# Patient Record
Sex: Female | Born: 1960 | Race: White | Hispanic: No | Marital: Married | Smoking: Never smoker
Health system: Southern US, Community
[De-identification: ages and names within clinical notes are randomized; demographics above are authoritative.]

## PROBLEM LIST (undated history)

## (undated) DIAGNOSIS — J45909 Unspecified asthma, uncomplicated: Secondary | ICD-10-CM

## (undated) DIAGNOSIS — L309 Dermatitis, unspecified: Secondary | ICD-10-CM

## (undated) DIAGNOSIS — I2542 Coronary artery dissection: Secondary | ICD-10-CM

## (undated) DIAGNOSIS — E039 Hypothyroidism, unspecified: Secondary | ICD-10-CM

## (undated) DIAGNOSIS — G4730 Sleep apnea, unspecified: Secondary | ICD-10-CM

## (undated) DIAGNOSIS — N183 Chronic kidney disease, stage 3 unspecified: Secondary | ICD-10-CM

## (undated) DIAGNOSIS — K760 Fatty (change of) liver, not elsewhere classified: Secondary | ICD-10-CM

## (undated) DIAGNOSIS — I219 Acute myocardial infarction, unspecified: Secondary | ICD-10-CM

## (undated) DIAGNOSIS — K219 Gastro-esophageal reflux disease without esophagitis: Secondary | ICD-10-CM

## (undated) DIAGNOSIS — R011 Cardiac murmur, unspecified: Secondary | ICD-10-CM

## (undated) DIAGNOSIS — C801 Malignant (primary) neoplasm, unspecified: Secondary | ICD-10-CM

## (undated) DIAGNOSIS — I10 Essential (primary) hypertension: Secondary | ICD-10-CM

## (undated) DIAGNOSIS — E042 Nontoxic multinodular goiter: Secondary | ICD-10-CM

## (undated) DIAGNOSIS — Z9289 Personal history of other medical treatment: Secondary | ICD-10-CM

## (undated) DIAGNOSIS — N189 Chronic kidney disease, unspecified: Secondary | ICD-10-CM

## (undated) HISTORY — PX: ESOPHAGECTOMY: SUR457

## (undated) HISTORY — DX: Coronary artery dissection: I25.42

## (undated) HISTORY — DX: Acute myocardial infarction, unspecified: I21.9

## (undated) HISTORY — PX: COLONOSCOPY: SHX174

## (undated) HISTORY — DX: Personal history of other medical treatment: Z92.89

## (undated) HISTORY — PX: TUBAL LIGATION: SHX77

## (undated) HISTORY — PX: EXPLORATORY LAPAROTOMY: SUR591

## (undated) HISTORY — PX: UPPER GI ENDOSCOPY: SHX6162

## (undated) HISTORY — PX: THYROID LOBECTOMY: SHX420

## (undated) HISTORY — DX: Chronic kidney disease, stage 3 unspecified: N18.30

## (undated) HISTORY — PX: ABDOMINAL HYSTERECTOMY: SHX81

## (undated) HISTORY — PX: DIAGNOSTIC LAPAROSCOPY: SUR761

---

## 2004-07-17 ENCOUNTER — Ambulatory Visit: Payer: Self-pay | Admitting: Unknown Physician Specialty

## 2004-08-14 ENCOUNTER — Ambulatory Visit: Payer: Self-pay | Admitting: Gastroenterology

## 2004-09-18 ENCOUNTER — Ambulatory Visit: Payer: Self-pay | Admitting: Gastroenterology

## 2005-09-10 ENCOUNTER — Ambulatory Visit: Payer: Self-pay

## 2006-09-15 ENCOUNTER — Ambulatory Visit: Payer: Self-pay

## 2007-09-27 ENCOUNTER — Ambulatory Visit: Payer: Self-pay

## 2007-10-03 ENCOUNTER — Ambulatory Visit: Payer: Self-pay

## 2008-08-15 ENCOUNTER — Ambulatory Visit: Payer: Self-pay

## 2008-10-03 ENCOUNTER — Ambulatory Visit: Payer: Self-pay

## 2009-10-08 ENCOUNTER — Ambulatory Visit: Payer: Self-pay

## 2010-11-04 ENCOUNTER — Ambulatory Visit: Payer: Self-pay

## 2011-10-07 ENCOUNTER — Ambulatory Visit: Payer: Self-pay

## 2011-10-28 ENCOUNTER — Ambulatory Visit: Payer: Self-pay | Admitting: Specialist

## 2011-11-04 ENCOUNTER — Ambulatory Visit: Payer: Self-pay | Admitting: Gastroenterology

## 2011-11-05 ENCOUNTER — Emergency Department: Payer: Self-pay | Admitting: Emergency Medicine

## 2011-11-05 LAB — CBC WITH DIFFERENTIAL/PLATELET
Basophil #: 0.1 10*3/uL (ref 0.0–0.1)
Eosinophil #: 0.1 10*3/uL (ref 0.0–0.7)
Eosinophil %: 1.1 %
HCT: 46.3 % (ref 35.0–47.0)
HGB: 15.3 g/dL (ref 12.0–16.0)
Lymphocyte #: 1.4 10*3/uL (ref 1.0–3.6)
Lymphocyte %: 17.6 %
MCH: 29 pg (ref 26.0–34.0)
MCHC: 33 g/dL (ref 32.0–36.0)
Monocyte #: 0.5 x10 3/mm (ref 0.2–0.9)
Monocyte %: 5.8 %
Neutrophil #: 5.9 10*3/uL (ref 1.4–6.5)
Neutrophil %: 74.8 %
Platelet: 218 10*3/uL (ref 150–440)

## 2011-11-05 LAB — COMPREHENSIVE METABOLIC PANEL
Albumin: 3.6 g/dL (ref 3.4–5.0)
Anion Gap: 8 (ref 7–16)
BUN: 12 mg/dL (ref 7–18)
Creatinine: 0.88 mg/dL (ref 0.60–1.30)
EGFR (African American): >60
Osmolality: 284 (ref 275–301)
SGOT(AST): 21 U/L (ref 15–37)
SGPT (ALT): 23 U/L
Total Protein: 7.4 g/dL (ref 6.4–8.2)

## 2011-11-05 LAB — URINALYSIS, COMPLETE
Protein: 30
RBC,UR: 8 /HPF (ref 0–5)
Specific Gravity: 1.027 (ref 1.003–1.030)
Squamous Epithelial: 8
WBC UR: 12 /HPF (ref 0–5)

## 2011-11-06 LAB — URINE CULTURE

## 2011-11-11 LAB — CULTURE, BLOOD (SINGLE)

## 2011-11-18 ENCOUNTER — Ambulatory Visit: Payer: Self-pay

## 2012-11-22 ENCOUNTER — Ambulatory Visit: Payer: Self-pay | Admitting: Family Medicine

## 2013-11-24 ENCOUNTER — Ambulatory Visit: Payer: Self-pay | Admitting: Family Medicine

## 2014-09-23 NOTE — Consult Note (Signed)
PATIENT NAME:  Lorraine Jimenez, Lorraine Jimenez MR#:  [id number] DATE OF BIRTH:  [birth year]  DATE OF CONSULTATION:  11/05/2011  REFERRING PHYSICIAN:  Alger Simons, MD CONSULTING PHYSICIAN:  Vivien Presto, MD  PRIMARY CARE PHYSICIAN: [facility].  REASON FOR CONSULTATION: Pleuritic pain.  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 The patient states that post procedures, at about 6:30, she started to develop left-sided pain in her back and also in her abdomen. The pain increases with positional changes and with deep respiration. She has had no fever, cough, or shortness of breath. On arrival here, she has no leukocytosis or fever and is breathing at 18 breaths per minute without hypoxia. The patient has had extensive work-up including CT of the chest without contrast which showed postoperative changes from previous distal esophageal resection and some atelectasis in the lower lobes, left greater than the right. The patient has had no fevers. Of note, the EGD yesterday showed normal stomach and examined duodenum. The colonoscopy showed a single recently bleeding colonic angiodysplastic lesion treated with thermal therapy with the examination otherwise being normal. The patient at that time was discharged per Dr. Candace Cruise. Medicine was consulted for aid with atelectasis.   PAST MEDICAL HISTORY:  1. Esophageal cancer stage I, per patient, status post resection in 2006. 2. Right thyroidectomy.  3. Hysterectomy.   SOCIAL HISTORY: No tobacco. Occasional alcohol. No other drug use.   FAMILY HISTORY: Mom with chronic obstructive pulmonary disease, diabetes, and coronary artery disease.   ALLERGIES: No known drug allergies.     MEDICATIONS:  1. Aspirin 81 mg daily.  2. Synthroid 50 mcg daily. 3. Vivelle/Dot 0.05 mg per 24 hour twice  weekly transdermal film extended-release.   REVIEW OF SYSTEMS: CONSTITUTIONAL: No fever, fatigue or weakness. Pain described as above. No weight changes. EYES: No blurry vision or double vision. ENT: No tinnitus, hearing loss, allergies, or postnasal drip. RESPIRATORY: No cough, wheezing, hemoptysis, or dyspnea on exertion. No chronic obstructive pulmonary disease. There is painful respiration with deep inspirations. CARDIOVASCULAR: No chest pain, orthopnea, edema, or arrhythmia. No syncope. GASTROINTESTINAL: No nausea or vomiting. Had two loose stools today. Had lower abdominal pain, currently resolved. History of esophageal cancer status post resection. GENITOURINARY: Denies dysuria, incontinence or frequency. ENDOCRINE: Denies polyuria, nocturia, or thyroid problems. HEME/LYMPH: Denies anemia or easy bruising. SKIN: Denies any rashes. MUSCULOSKELETAL: Denies any chronic arthritis. NEURO: Denies numbness, weakness, or dementia. PSYCHIATRIC: Denies anxiety or insomnia.   PHYSICAL EXAMINATION:   VITAL SIGNS: Temperature was 96.2 on arrival, pulse 70, respiratory rate 18, blood pressure 141/73, and oxygen saturation 100% on room air on arrival and in the 90s currently.   GENERAL: The patient a well-developed, obese Caucasian female sitting in bed, in no obvious distress.   HEENT: Normocephalic, atraumatic. Pupils are equal and reactive. Anicteric sclerae. Moist mucous membranes.   NECK: Supple. No thyroid tenderness.   CARDIOVASCULAR: S1 and S2 regular rate and rhythm. No murmurs, rubs, or gallops.   LUNGS: Mostly clear to auscultation. Slight crackles in the left base. No wheezing or rhonchi.   ABDOMEN: Soft, nontender, and nondistended. Positive bowel sounds in all quadrants. There are healed midsternal and lower epigastric area midline scars without drainage. No costophrenic tenderness, on the right or the left.   EXTREMITIES: No significant lower extremity edema.   NEUROLOGICAL: Cranial  nerves II through  XII grossly intact. Strength 5/5 in all extremities. Sensation is intact to light touch.  SKIN: Warm without cyanosis, or clubbing.   PSYCH: Awake, alert, and oriented x3.   LABS/STUDIES: Glucose 111, BUN 12, creatinine 0.88, sodium 142, and potassium 3.6. LFTs: Bilirubin is 1.5, otherwise within normal limits. WBC 7.9, hemoglobin 15.3, and hematocrit 46.3.   Urinalysis: 1+ ketone, no nitrites, 1+ leukocyte esterase, 12 WBC, and trace bacteria.  CT of the chest without contrast: As described above.   CT of abdomen and pelvis with contrast: Abnormal appearance of the posterior portion of the diaphragm. Compressive atelectasis in the left lower lobe and right lower lobe. An infiltrate is not completely excluded. No pneumatosis or pneumoperitoneum. No bowel obstruction or perforation. No significant diverticulosis. No acute diverticulitis.   EKG: Sinus bradycardia, rate 159. No acute ST changes.   ASSESSMENT AND PLAN: We have a 54 year old Caucasian female with history of esophageal cancer status post resection who underwent EGD and colonoscopy yesterday with subsequent development of what appears to be atelectasis, left greater than the right, without hypoxia, fever, cough, shortness of breath, or leukocytosis. I doubt the patient is having a pneumonia. The patient has been given some fentanyl and a dose of Levaquin. For the atelectasis, I would do as you are doing with pain control, with deep breathing exercises, and incentive spirometry. The patient at this point wants to go home and feels better with pain controlled. She is not on oxygen. I would not continue the antibiotics and I do not see any acute indications and doubt the patient is having a pneumonia. I discussed the case with the patient and Dr. Roxine Caddy. Furthermore, the GI physician on call, Dr. Gustavo Lah, has been consulted. The patient feels fairly comfortable after pain control with fentanyl. I discussed the case with the  patient and if symptoms progress, the patient has a fever, rigors, chills, shortness of breath, or increased pleuritic pain she can call her primary care physician or come back to the ER for further evaluation. I would continue her aspirin and Synthroid with her hormone replacement post discharge.   Thank you for the kind consult. At this point, I would recommend outpatient follow-up for this. ____________________________ Vivien Presto, MD sa:slb D: 11/05/2011 19:08:32 ET T: 11/06/2011 10:25:22 ET JOB#: [id number]  cc: Vivien Presto, MD, <Dictator> Simpson MD ELECTRONICALLY SIGNED 11/20/2011 20:00

## 2014-10-23 ENCOUNTER — Other Ambulatory Visit: Payer: Self-pay | Admitting: Family Medicine

## 2014-10-23 DIAGNOSIS — Z1231 Encounter for screening mammogram for malignant neoplasm of breast: Secondary | ICD-10-CM

## 2014-11-27 ENCOUNTER — Ambulatory Visit: Payer: Self-pay

## 2014-11-29 ENCOUNTER — Ambulatory Visit
Admission: RE | Admit: 2014-11-29 | Discharge: 2014-11-29 | Disposition: A | Discharge status: Home / Self Care | Payer: BLUE CROSS/BLUE SHIELD | Source: Ambulatory Visit | Attending: Family Medicine | Admitting: Family Medicine

## 2014-11-29 DIAGNOSIS — Z1231 Encounter for screening mammogram for malignant neoplasm of breast: Secondary | ICD-10-CM | POA: Diagnosis not present

## 2014-11-29 HISTORY — DX: Malignant (primary) neoplasm, unspecified: C80.1

## 2015-10-29 ENCOUNTER — Other Ambulatory Visit: Payer: Self-pay | Admitting: Family Medicine

## 2015-10-29 DIAGNOSIS — Z1231 Encounter for screening mammogram for malignant neoplasm of breast: Secondary | ICD-10-CM

## 2015-12-05 ENCOUNTER — Ambulatory Visit: Payer: BLUE CROSS/BLUE SHIELD

## 2015-12-26 ENCOUNTER — Other Ambulatory Visit: Payer: Self-pay | Admitting: Family Medicine

## 2015-12-26 ENCOUNTER — Ambulatory Visit
Admission: RE | Admit: 2015-12-26 | Discharge: 2015-12-26 | Disposition: A | Discharge status: Home / Self Care | Payer: BLUE CROSS/BLUE SHIELD | Source: Ambulatory Visit | Attending: Family Medicine | Admitting: Family Medicine

## 2015-12-26 DIAGNOSIS — Z1231 Encounter for screening mammogram for malignant neoplasm of breast: Secondary | ICD-10-CM

## 2016-11-16 ENCOUNTER — Other Ambulatory Visit: Payer: Self-pay | Admitting: Family Medicine

## 2016-11-16 DIAGNOSIS — Z1231 Encounter for screening mammogram for malignant neoplasm of breast: Secondary | ICD-10-CM

## 2016-12-28 ENCOUNTER — Ambulatory Visit
Admission: RE | Admit: 2016-12-28 | Discharge: 2016-12-28 | Disposition: A | Discharge status: Home / Self Care | Payer: BLUE CROSS/BLUE SHIELD | Source: Ambulatory Visit | Attending: Family Medicine | Admitting: Family Medicine

## 2016-12-28 DIAGNOSIS — Z1231 Encounter for screening mammogram for malignant neoplasm of breast: Secondary | ICD-10-CM | POA: Diagnosis not present

## 2017-10-29 ENCOUNTER — Other Ambulatory Visit: Payer: Self-pay | Admitting: Family Medicine

## 2017-10-29 DIAGNOSIS — Z1231 Encounter for screening mammogram for malignant neoplasm of breast: Secondary | ICD-10-CM

## 2017-12-29 ENCOUNTER — Ambulatory Visit
Admission: RE | Admit: 2017-12-29 | Discharge: 2017-12-29 | Disposition: A | Discharge status: Home / Self Care | Payer: BLUE CROSS/BLUE SHIELD | Source: Ambulatory Visit | Attending: Family Medicine | Admitting: Family Medicine

## 2017-12-29 DIAGNOSIS — Z1231 Encounter for screening mammogram for malignant neoplasm of breast: Secondary | ICD-10-CM | POA: Diagnosis not present

## 2018-11-21 ENCOUNTER — Other Ambulatory Visit: Payer: Self-pay | Admitting: Family Medicine

## 2018-11-21 DIAGNOSIS — Z1231 Encounter for screening mammogram for malignant neoplasm of breast: Secondary | ICD-10-CM

## 2019-01-02 ENCOUNTER — Ambulatory Visit
Admission: RE | Admit: 2019-01-02 | Discharge: 2019-01-02 | Disposition: A | Discharge status: Home / Self Care | Payer: BC Managed Care – PPO | Source: Ambulatory Visit | Attending: Family Medicine | Admitting: Family Medicine

## 2019-01-02 ENCOUNTER — Other Ambulatory Visit: Payer: Self-pay

## 2019-01-02 DIAGNOSIS — Z1231 Encounter for screening mammogram for malignant neoplasm of breast: Secondary | ICD-10-CM

## 2019-06-07 ENCOUNTER — Ambulatory Visit: Payer: BLUE CROSS/BLUE SHIELD | Attending: Internal Medicine

## 2019-06-07 DIAGNOSIS — Z20822 Contact with and (suspected) exposure to covid-19: Secondary | ICD-10-CM

## 2019-06-08 LAB — NOVEL CORONAVIRUS, NAA: SARS-CoV-2, NAA: NOT DETECTED

## 2019-06-14 ENCOUNTER — Ambulatory Visit: Payer: BLUE CROSS/BLUE SHIELD | Attending: Internal Medicine

## 2019-06-14 DIAGNOSIS — Z20822 Contact with and (suspected) exposure to covid-19: Secondary | ICD-10-CM

## 2019-06-15 LAB — NOVEL CORONAVIRUS, NAA: SARS-CoV-2, NAA: NOT DETECTED

## 2019-12-07 ENCOUNTER — Other Ambulatory Visit: Payer: Self-pay

## 2019-12-07 DIAGNOSIS — Z1231 Encounter for screening mammogram for malignant neoplasm of breast: Secondary | ICD-10-CM

## 2020-01-03 ENCOUNTER — Other Ambulatory Visit: Payer: Self-pay

## 2020-01-03 ENCOUNTER — Ambulatory Visit
Admission: RE | Admit: 2020-01-03 | Discharge: 2020-01-03 | Disposition: A | Discharge status: Home / Self Care | Payer: BC Managed Care – PPO | Source: Ambulatory Visit

## 2020-01-03 DIAGNOSIS — Z1231 Encounter for screening mammogram for malignant neoplasm of breast: Secondary | ICD-10-CM | POA: Insufficient documentation

## 2020-03-13 ENCOUNTER — Other Ambulatory Visit: Payer: Self-pay

## 2020-03-13 ENCOUNTER — Other Ambulatory Visit
Admission: RE | Admit: 2020-03-13 | Discharge: 2020-03-13 | Disposition: A | Discharge status: Home / Self Care | Payer: BC Managed Care – PPO | Source: Ambulatory Visit | Attending: Gastroenterology | Admitting: Gastroenterology

## 2020-03-13 DIAGNOSIS — Z01812 Encounter for preprocedural laboratory examination: Secondary | ICD-10-CM | POA: Insufficient documentation

## 2020-03-13 DIAGNOSIS — Z20822 Contact with and (suspected) exposure to covid-19: Secondary | ICD-10-CM | POA: Insufficient documentation

## 2020-03-14 ENCOUNTER — Encounter: Payer: Self-pay | Admitting: *Deleted

## 2020-03-14 LAB — SARS CORONAVIRUS 2 (TAT 6-24 HRS): SARS Coronavirus 2: NEGATIVE

## 2020-03-15 ENCOUNTER — Ambulatory Visit: Payer: BC Managed Care – PPO | Admitting: Anesthesiology

## 2020-03-15 ENCOUNTER — Encounter
Admission: RE | Disposition: A | Discharge status: Home / Self Care | Payer: Self-pay | Source: Home / Self Care | Attending: Gastroenterology

## 2020-03-15 ENCOUNTER — Encounter: Payer: Self-pay | Admitting: *Deleted

## 2020-03-15 ENCOUNTER — Ambulatory Visit
Admission: RE | Admit: 2020-03-15 | Discharge: 2020-03-15 | Disposition: A | Discharge status: Home / Self Care | Payer: BC Managed Care – PPO | Attending: Gastroenterology | Admitting: Gastroenterology

## 2020-03-15 DIAGNOSIS — I129 Hypertensive chronic kidney disease with stage 1 through stage 4 chronic kidney disease, or unspecified chronic kidney disease: Secondary | ICD-10-CM | POA: Insufficient documentation

## 2020-03-15 DIAGNOSIS — N183 Chronic kidney disease, stage 3 unspecified: Secondary | ICD-10-CM | POA: Diagnosis not present

## 2020-03-15 DIAGNOSIS — Z79890 Hormone replacement therapy (postmenopausal): Secondary | ICD-10-CM | POA: Diagnosis not present

## 2020-03-15 DIAGNOSIS — G4730 Sleep apnea, unspecified: Secondary | ICD-10-CM | POA: Diagnosis not present

## 2020-03-15 DIAGNOSIS — Z9049 Acquired absence of other specified parts of digestive tract: Secondary | ICD-10-CM | POA: Diagnosis not present

## 2020-03-15 DIAGNOSIS — Z79899 Other long term (current) drug therapy: Secondary | ICD-10-CM | POA: Insufficient documentation

## 2020-03-15 DIAGNOSIS — K219 Gastro-esophageal reflux disease without esophagitis: Secondary | ICD-10-CM | POA: Diagnosis not present

## 2020-03-15 DIAGNOSIS — D122 Benign neoplasm of ascending colon: Secondary | ICD-10-CM | POA: Diagnosis not present

## 2020-03-15 DIAGNOSIS — K2281 Esophageal polyp: Secondary | ICD-10-CM | POA: Insufficient documentation

## 2020-03-15 DIAGNOSIS — K5730 Diverticulosis of large intestine without perforation or abscess without bleeding: Secondary | ICD-10-CM | POA: Diagnosis not present

## 2020-03-15 DIAGNOSIS — Z8501 Personal history of malignant neoplasm of esophagus: Secondary | ICD-10-CM | POA: Insufficient documentation

## 2020-03-15 DIAGNOSIS — K319 Disease of stomach and duodenum, unspecified: Secondary | ICD-10-CM | POA: Insufficient documentation

## 2020-03-15 DIAGNOSIS — K921 Melena: Secondary | ICD-10-CM | POA: Diagnosis not present

## 2020-03-15 DIAGNOSIS — E039 Hypothyroidism, unspecified: Secondary | ICD-10-CM | POA: Diagnosis not present

## 2020-03-15 DIAGNOSIS — K317 Polyp of stomach and duodenum: Secondary | ICD-10-CM | POA: Insufficient documentation

## 2020-03-15 DIAGNOSIS — D123 Benign neoplasm of transverse colon: Secondary | ICD-10-CM | POA: Insufficient documentation

## 2020-03-15 DIAGNOSIS — J45909 Unspecified asthma, uncomplicated: Secondary | ICD-10-CM | POA: Insufficient documentation

## 2020-03-15 DIAGNOSIS — R109 Unspecified abdominal pain: Secondary | ICD-10-CM | POA: Insufficient documentation

## 2020-03-15 DIAGNOSIS — K640 First degree hemorrhoids: Secondary | ICD-10-CM | POA: Diagnosis not present

## 2020-03-15 HISTORY — DX: Gastro-esophageal reflux disease without esophagitis: K21.9

## 2020-03-15 HISTORY — DX: Dermatitis, unspecified: L30.9

## 2020-03-15 HISTORY — DX: Fatty (change of) liver, not elsewhere classified: K76.0

## 2020-03-15 HISTORY — PX: COLONOSCOPY WITH PROPOFOL: SHX5780

## 2020-03-15 HISTORY — DX: Hypothyroidism, unspecified: E03.9

## 2020-03-15 HISTORY — DX: Cardiac murmur, unspecified: R01.1

## 2020-03-15 HISTORY — DX: Essential (primary) hypertension: I10

## 2020-03-15 HISTORY — DX: Sleep apnea, unspecified: G47.30

## 2020-03-15 HISTORY — DX: Nontoxic multinodular goiter: E04.2

## 2020-03-15 HISTORY — DX: Chronic kidney disease, unspecified: N18.9

## 2020-03-15 HISTORY — PX: ESOPHAGOGASTRODUODENOSCOPY (EGD) WITH PROPOFOL: SHX5813

## 2020-03-15 HISTORY — DX: Unspecified asthma, uncomplicated: J45.909

## 2020-03-15 SURGERY — COLONOSCOPY WITH PROPOFOL
Anesthesia: General

## 2020-03-15 MED ORDER — DEXAMETHASONE SODIUM PHOSPHATE 10 MG/ML IJ SOLN
INTRAMUSCULAR | Status: AC
  Filled 2020-03-15: qty 1

## 2020-03-15 MED ORDER — GLYCOPYRROLATE 0.2 MG/ML IJ SOLN
INTRAMUSCULAR | Status: DC | PRN
  Administered 2020-03-15: .2 mg via INTRAVENOUS

## 2020-03-15 MED ORDER — DEXAMETHASONE SODIUM PHOSPHATE 10 MG/ML IJ SOLN
INTRAMUSCULAR | Status: DC | PRN
  Administered 2020-03-15: 10 mg via INTRAVENOUS

## 2020-03-15 MED ORDER — SODIUM CHLORIDE 0.9 % IV SOLN
INTRAVENOUS | Status: DC
  Administered 2020-03-15: 1000 mL via INTRAVENOUS

## 2020-03-15 MED ORDER — GLYCOPYRROLATE 0.2 MG/ML IJ SOLN
INTRAMUSCULAR | Status: AC
  Filled 2020-03-15: qty 1

## 2020-03-15 MED ORDER — SUCCINYLCHOLINE CHLORIDE 200 MG/10ML IV SOSY
PREFILLED_SYRINGE | INTRAVENOUS | Status: AC
  Filled 2020-03-15: qty 10

## 2020-03-15 MED ORDER — ONDANSETRON HCL 4 MG/2ML IJ SOLN
INTRAMUSCULAR | Status: DC | PRN
  Administered 2020-03-15: 4 mg via INTRAVENOUS

## 2020-03-15 MED ORDER — MIDAZOLAM HCL 2 MG/2ML IJ SOLN
INTRAMUSCULAR | Status: DC | PRN
  Administered 2020-03-15 (×2): 1 mg via INTRAVENOUS

## 2020-03-15 MED ORDER — FENTANYL CITRATE (PF) 100 MCG/2ML IJ SOLN
INTRAMUSCULAR | Status: DC | PRN
  Administered 2020-03-15 (×2): 50 ug via INTRAVENOUS

## 2020-03-15 MED ORDER — ONDANSETRON HCL 4 MG/2ML IJ SOLN
INTRAMUSCULAR | Status: AC
  Filled 2020-03-15: qty 2

## 2020-03-15 MED ORDER — PROPOFOL 10 MG/ML IV BOLUS
INTRAVENOUS | Status: DC | PRN
  Administered 2020-03-15: 50 mg via INTRAVENOUS
  Administered 2020-03-15: 150 mg via INTRAVENOUS

## 2020-03-15 MED ORDER — LIDOCAINE HCL (PF) 2 % IJ SOLN
INTRAMUSCULAR | Status: AC
  Filled 2020-03-15: qty 5

## 2020-03-15 MED ORDER — FENTANYL CITRATE (PF) 100 MCG/2ML IJ SOLN
INTRAMUSCULAR | Status: AC
  Filled 2020-03-15: qty 2

## 2020-03-15 MED ORDER — PROPOFOL 10 MG/ML IV BOLUS
INTRAVENOUS | Status: AC
  Filled 2020-03-15: qty 20

## 2020-03-15 MED ORDER — LIDOCAINE HCL (CARDIAC) PF 100 MG/5ML IV SOSY
PREFILLED_SYRINGE | INTRAVENOUS | Status: DC | PRN
Start: 2020-03-15 — End: 2020-03-15
  Administered 2020-03-15: 50 mg via INTRAVENOUS

## 2020-03-15 MED ORDER — SUCCINYLCHOLINE CHLORIDE 20 MG/ML IJ SOLN
INTRAMUSCULAR | Status: DC | PRN
  Administered 2020-03-15: 120 mg via INTRAVENOUS

## 2020-03-15 MED ORDER — MIDAZOLAM HCL 2 MG/2ML IJ SOLN
INTRAMUSCULAR | Status: AC
  Filled 2020-03-15: qty 2

## 2020-03-15 NOTE — Op Note (Signed)
[facility] Patient Name: Lorraine Jimenez Procedure Date: 03/15/2020 7:20 AM MRN: [id number] Account #: [id number] Date of Birth: Dec 03, 1960 Admit Type: Outpatient Age: 59 Room: [place] ENDO ROOM 3 Gender: Female Note Status: Finalized Procedure:             Upper GI endoscopy Indications:           Melena Providers:             Andrey Farmer MD, MD Referring MD:          Ricardo Jericho (Referring MD) Medicines:             Monitored Anesthesia Care Complications:         No immediate complications. Estimated blood loss:                         Minimal. Procedure:             Pre-Anesthesia Assessment:                        - Prior to the procedure, a History and Physical was                         performed, and patient medications and allergies were                         reviewed. The patient is competent. The risks and                         benefits of the procedure and the sedation options and                         risks were discussed with the patient. All questions                         were answered and informed consent was obtained.                         Patient identification and proposed procedure were                         verified by the physician, the nurse, the anesthetist                         and the technician in the endoscopy suite. Mental                         Status Examination: alert and oriented. Airway                         Examination: normal oropharyngeal airway and neck                         mobility. Respiratory Examination: clear to                         auscultation. CV Examination: normal. Prophylactic                         Antibiotics: The  patient does not require prophylactic                         antibiotics. Prior Anticoagulants: The patient has                         taken no previous anticoagulant or antiplatelet                         agents. ASA Grade Assessment: III - A  patient with                         severe systemic disease. After reviewing the risks and                         benefits, the patient was deemed in satisfactory                         condition to undergo the procedure. The anesthesia                         plan was to use general anesthesia. Immediately prior                         to administration of medications, the patient was                         re-assessed for adequacy to receive sedatives. The                         heart rate, respiratory rate, oxygen saturations,                         blood pressure, adequacy of pulmonary ventilation, and                         response to care were monitored throughout the                         procedure. The physical status of the patient was                         re-assessed after the procedure.                        After obtaining informed consent, the endoscope was                         passed under direct vision. Throughout the procedure,                         the patient's blood pressure, pulse, and oxygen                         saturations were monitored continuously. The Endoscope                         was introduced through the mouth, and advanced to the  second part of duodenum. The upper GI endoscopy was                         accomplished without difficulty. The patient tolerated                         the procedure well. Findings:      A partial esophagectomy anastomosis was found in the entire esophagus.      Two 3 mm polyps were found at the esophageal anastomosis. Area was very       friable. Biopsies were taken with a cold forceps for histology.       Estimated blood loss was minimal.      A single 4 mm sessile polyp with no stigmata of recent bleeding was       found at the anastomosis but in what appeared to be the gastric pull up       portion. Biopsies were taken with a cold forceps for histology.       Estimated blood  loss was minimal.      Diffuse mildly erythematous mucosa without bleeding was found in the       gastric antrum. Biopsies were taken with a cold forceps for Helicobacter       pylori testing. Estimated blood loss was minimal.      The examined duodenum was normal. Impression:            - A partial esophagectomy anastomosis was found.                        - Esophageal polyp(s) were found. Biopsied.                        - A single gastric polyp. Biopsied.                        - Erythematous mucosa in the antrum. Biopsied.                        - Normal examined duodenum. Recommendation:        - Discharge patient to home.                        - Resume previous diet.                        - Continue present medications.                        - Await pathology results.                        - Return to referring physician as previously                         scheduled. Procedure Code(s):     --- Professional ---                        [id number], Esophagogastroduodenoscopy, flexible,                         transoral; with biopsy, single or multiple Diagnosis Code(s):     ---  Professional ---                        [phone], Other specified postprocedural states                        K22.8, Other specified diseases of esophagus                        K31.7, Polyp of stomach and duodenum                        K31.89, Other diseases of stomach and duodenum                        K92.1, Melena (includes Hematochezia) CPT copyright 2019 American Medical Association. All rights reserved. The codes documented in this report are preliminary and upon coder review may  be revised to meet current compliance requirements. Andrey Farmer, MD Andrey Farmer MD, MD 03/15/2020 8:58:17 AM Number of Addenda: 0 Note Initiated On: 03/15/2020 7:20 AM Estimated Blood Loss:  Estimated blood loss was minimal.      [facility]

## 2020-03-15 NOTE — H&P (Signed)
Outpatient short stay form Pre-procedure 03/15/2020 8:07 AM Raylene Miyamoto MD, MPH  Primary Physician: NP Vevelyn Royals  Reason for visit:  History of melena  History of present illness:   59 y/o lady with history of esophagectomy for esophageal cancer from [facility] here for EGD/Colonoscopy for history of abdominal pain and report of melena. Last colonoscopy in 2013 with AVM noted and ablated. History of hysterectomy but no blood thinners.    Current Facility-Administered Medications:  .  0.9 %  sodium chloride infusion, , Intravenous, Continuous, Ajahni Nay, Hilton Cork, MD, Last Rate: 20 mL/hr at 03/15/20 0742, 1,000 mL at 03/15/20 1324  Facility-Administered Medications Ordered in Other Encounters:  .  midazolam (VERSED) injection, , Intravenous, Anesthesia Intra-op, Jonna Clark, CRNA, 1 mg at 03/15/20 4010  Medications Prior to Admission  Medication Sig Dispense Refill Last Dose  . albuterol (PROVENTIL) (2.5 MG/3ML) 0.083% nebulizer solution Take 2.5 mg by nebulization every 6 (six) hours as needed for wheezing or shortness of breath.    at prn  . albuterol (VENTOLIN HFA) 108 (90 Base) MCG/ACT inhaler Inhale 2 puffs into the lungs every 6 (six) hours as needed for wheezing or shortness of breath.    at prn  . amLODipine (NORVASC) 10 MG tablet Take 10 mg by mouth daily.   03/15/2020 at 0500  . azelastine (ASTELIN) 0.1 % nasal spray Place 2 sprays into both nostrils 2 (two) times daily. Use in each nostril as directed   03/14/2020 at Unknown time  . budesonide-formoterol (SYMBICORT) 160-4.5 MCG/ACT inhaler Inhale 2 puffs into the lungs 2 (two) times daily.    at prn  . levothyroxine (SYNTHROID) 50 MCG tablet Take 50 mcg by mouth daily before breakfast.   03/15/2020 at 0500  . losartan (COZAAR) 100 MG tablet Take 100 mg by mouth daily.   03/15/2020 at 0500  . montelukast (SINGULAIR) 10 MG tablet Take 10 mg by mouth at bedtime.   Past Week at Unknown time  . methocarbamol (ROBAXIN) 500 MG  tablet Take 500 mg by mouth 4 (four) times daily. (Patient not taking: Reported on 03/15/2020)   Not Taking at Unknown time     No Known Allergies   Past Medical History:  Diagnosis Date  . Asthma   . Cancer ([place])    esophageal  . Chronic kidney disease    CKD - Stage 3  . Eczema   . Fatty liver   . GERD (gastroesophageal reflux disease)   . Heart murmur   . Hypertension   . Hypothyroidism   . Multinodular goiter   . Sleep apnea     Review of systems:  Otherwise negative.    Physical Exam  Gen: Alert, oriented. Appears stated age.  HEENT: [place]/AT. PERRLA. Lungs: No respiratory distress Abd: soft, benign, no masses.  Ext: No edema.     Planned procedures: Proceed with colonoscopy. The patient understands the nature of the planned procedure, indications, risks, alternatives and potential complications including but not limited to bleeding, infection, perforation, damage to internal organs and possible oversedation/side effects from anesthesia. The patient agrees and gives consent to proceed.  Please refer to procedure notes for findings, recommendations and patient disposition/instructions.     Raylene Miyamoto MD, MPH Gastroenterology 03/15/2020  8:07 AM

## 2020-03-15 NOTE — Interval H&P Note (Signed)
History and Physical Interval Note:  03/15/2020 8:10 AM  Lorraine Jimenez  has presented today for surgery, with the diagnosis of RECTAL BLEEDING.  The various methods of treatment have been discussed with the patient and family. After consideration of risks, benefits and other options for treatment, the patient has consented to  Procedure(s): COLONOSCOPY WITH PROPOFOL (N/A) ESOPHAGOGASTRODUODENOSCOPY (EGD) WITH PROPOFOL (N/A) as a surgical intervention.  The patient's history has been reviewed, patient examined, no change in status, stable for surgery.  I have reviewed the patient's chart and labs.  Questions were answered to the patient's satisfaction.     Lesly Rubenstein  Ok to proceed with EGD/Colonoscopy.

## 2020-03-15 NOTE — Transfer of Care (Signed)
Immediate Anesthesia Transfer of Care Note  Patient: Lorraine Jimenez  Procedure(s) Performed: COLONOSCOPY WITH PROPOFOL (N/A ) ESOPHAGOGASTRODUODENOSCOPY (EGD) WITH PROPOFOL (N/A )  Patient Location: PACU  Anesthesia Type:General  Level of Consciousness: drowsy and patient cooperative  Airway & Oxygen Therapy: Patient Spontanous Breathing and Patient connected to face mask oxygen  Post-op Assessment: Report given to RN and Post -op Vital signs reviewed and stable  Post vital signs: Reviewed and stable  Last Vitals:  Vitals Value Taken Time  BP 101/57 03/15/20 0906  Temp    Pulse 94 03/15/20 0906  Resp 19 03/15/20 0906  SpO2 97 % 03/15/20 0906  Vitals shown include unvalidated device data.  Last Pain:  Vitals:   03/15/20 0723  TempSrc: Temporal  PainSc: 0-No pain         Complications: No complications documented.

## 2020-03-15 NOTE — Anesthesia Procedure Notes (Signed)
Procedure Name: Intubation Date/Time: 03/15/2020 8:11 AM Performed by: Jonna Clark, CRNA Pre-anesthesia Checklist: Patient identified, Patient being monitored, Timeout performed, Emergency Drugs available and Suction available Patient Re-evaluated:Patient Re-evaluated prior to induction Oxygen Delivery Method: Circle system utilized Preoxygenation: Pre-oxygenation with 100% oxygen Induction Type: IV induction Ventilation: Mask ventilation without difficulty Laryngoscope Size: 3 and McGraph Grade View: Grade II Tube type: Oral Tube size: 7.0 mm Number of attempts: 1 Airway Equipment and Method: Stylet Placement Confirmation: ETT inserted through vocal cords under direct vision,  positive ETCO2 and breath sounds checked- equal and bilateral Secured at: 21 cm Tube secured with: Tape Dental Injury: Teeth and Oropharynx as per pre-operative assessment

## 2020-03-15 NOTE — Progress Notes (Addendum)
   03/15/20 0735  Clinical Encounter Type  Visited With Family  Visit Type Initial  Referral From Chaplain  Consult/Referral To Chaplain  While rounding SDS waiting area, chaplain spoke to Pt's husband to see how he was doing while waiting. He said he was fine.

## 2020-03-15 NOTE — Anesthesia Preprocedure Evaluation (Signed)
Anesthesia Evaluation  Patient identified by MRN, date of birth, ID band Patient awake    Reviewed: Allergy & Precautions, NPO status , Patient's Chart, lab work & pertinent test results  History of Anesthesia Complications Negative for: history of anesthetic complications  Airway Mallampati: II       Dental   Pulmonary asthma , sleep apnea (not able to tolerate CPAP) , neg COPD, Not current smoker,           Cardiovascular hypertension, Pt. on medications (-) Past MI and (-) CHF (-) dysrhythmias + Valvular Problems/Murmurs (murmur, no tx)      Neuro/Psych neg Seizures    GI/Hepatic Neg liver ROS, GERD  Poorly Controlled,  Endo/Other  neg diabetesHypothyroidism   Renal/GU Renal InsufficiencyRenal disease     Musculoskeletal   Abdominal   Peds  Hematology   Anesthesia Other Findings   Reproductive/Obstetrics                             Anesthesia Physical Anesthesia Plan  ASA: III  Anesthesia Plan: General   Post-op Pain Management:    Induction: Intravenous  PONV Risk Score and Plan: 3 and Propofol infusion, TIVA and Treatment may vary due to age or medical condition  Airway Management Planned: Nasal Cannula  Additional Equipment:   Intra-op Plan:   Post-operative Plan:   Informed Consent: I have reviewed the patients History and Physical, chart, labs and discussed the procedure including the risks, benefits and alternatives for the proposed anesthesia with the patient or authorized representative who has indicated his/her understanding and acceptance.       Plan Discussed with:   Anesthesia Plan Comments:         Anesthesia Quick Evaluation

## 2020-03-15 NOTE — Op Note (Signed)
[facility] Gastroenterology Patient Name: Lorraine Jimenez Procedure Date: 03/15/2020 7:19 AM MRN: [id number] Account #: [id number] Date of Birth: Nov 19, 1960 Admit Type: Outpatient Age: 59 Room: [facility] ENDO ROOM 3 Gender: Female Note Status: Finalized Procedure:             Colonoscopy Indications:           Rectal bleeding Providers:             Andrey Farmer MD, MD Referring MD:          Ricardo Jericho (Referring MD) Medicines:             Monitored Anesthesia Care Complications:         No immediate complications. Estimated blood loss:                         Minimal. Procedure:             Pre-Anesthesia Assessment:                        - Prior to the procedure, a History and Physical was                         performed, and patient medications and allergies were                         reviewed. The patient is competent. The risks and                         benefits of the procedure and the sedation options and                         risks were discussed with the patient. All questions                         were answered and informed consent was obtained.                         Patient identification and proposed procedure were                         verified by the physician, the nurse, the anesthetist                         and the technician in the endoscopy suite. Mental                         Status Examination: alert and oriented. Airway                         Examination: normal oropharyngeal airway and neck                         mobility. Respiratory Examination: clear to                         auscultation. CV Examination: normal. Prophylactic                         Antibiotics: The patient  does not require prophylactic                         antibiotics. Prior Anticoagulants: The patient has                         taken no previous anticoagulant or antiplatelet                         agents. ASA Grade Assessment: III - A  patient with                         severe systemic disease. After reviewing the risks and                         benefits, the patient was deemed in satisfactory                         condition to undergo the procedure. The anesthesia                         plan was to use general anesthesia. Immediately prior                         to administration of medications, the patient was                         re-assessed for adequacy to receive sedatives. The                         heart rate, respiratory rate, oxygen saturations,                         blood pressure, adequacy of pulmonary ventilation, and                         response to care were monitored throughout the                         procedure. The physical status of the patient was                         re-assessed after the procedure.                        After obtaining informed consent, the colonoscope was                         passed under direct vision. Throughout the procedure,                         the patient's blood pressure, pulse, and oxygen                         saturations were monitored continuously. The                         Colonoscope was introduced through the anus and  advanced to the the cecum, identified by appendiceal                         orifice and ileocecal valve. The colonoscopy was                         technically difficult and complex due to restricted                         mobility of the colon. The patient tolerated the                         procedure well. The quality of the bowel preparation                         was good. Findings:      The perianal and digital rectal examinations were normal.      A 1 mm polyp was found in the ascending colon. The polyp was sessile.       The polyp was removed with a jumbo cold forceps. Resection and retrieval       were complete. Estimated blood loss was minimal.      A 1 mm polyp was found in the  splenic flexure. The polyp was sessile.       The polyp was removed with a jumbo cold forceps. Resection and retrieval       were complete. Estimated blood loss was minimal.      A few small-mouthed diverticula were found in the sigmoid colon.      Non-bleeding internal hemorrhoids were found during retroflexion. The       hemorrhoids were Grade I (internal hemorrhoids that do not prolapse).      The exam was otherwise without abnormality on direct and retroflexion       views. Impression:            - One 1 mm polyp in the ascending colon, removed with                         a jumbo cold forceps. Resected and retrieved.                        - One 1 mm polyp at the splenic flexure, removed with                         a jumbo cold forceps. Resected and retrieved.                        - Diverticulosis in the sigmoid colon.                        - Non-bleeding internal hemorrhoids.                        - The examination was otherwise normal on direct and                         retroflexion views. Recommendation:        - Discharge patient to home.                        -  Resume previous diet.                        - Continue present medications.                        - Await pathology results.                        - Repeat colonoscopy for surveillance based on                         pathology results.                        - Return to referring physician as previously                         scheduled. Procedure Code(s):     --- Professional ---                        [phone], Colonoscopy, flexible; with biopsy, single or                         multiple Diagnosis Code(s):     --- Professional ---                        K63.5, Polyp of colon                        K64.0, First degree hemorrhoids                        K62.5, Hemorrhage of anus and rectum                        K57.30, Diverticulosis of large intestine without                         perforation or abscess  without bleeding CPT copyright 2019 [employer] Association. All rights reserved. The codes documented in this report are preliminary and upon coder review may  be revised to meet current compliance requirements. Andrey Farmer, MD Andrey Farmer MD, MD 03/15/2020 9:01:47 AM Number of Addenda: 0 Note Initiated On: 03/15/2020 7:19 AM Scope Withdrawal Time: 0 hours 9 minutes 30 seconds  Total Procedure Duration: 0 hours 17 minutes 42 seconds  Estimated Blood Loss:  Estimated blood loss was minimal.      [facility]

## 2020-03-15 NOTE — Anesthesia Postprocedure Evaluation (Signed)
Anesthesia Post Note  Patient: Lorraine Jimenez  Procedure(s) Performed: COLONOSCOPY WITH PROPOFOL (N/A ) ESOPHAGOGASTRODUODENOSCOPY (EGD) WITH PROPOFOL (N/A )  Patient location during evaluation: Endoscopy Anesthesia Type: General Level of consciousness: awake and alert Pain management: pain level controlled Vital Signs Assessment: post-procedure vital signs reviewed and stable Respiratory status: spontaneous breathing and respiratory function stable Cardiovascular status: stable Anesthetic complications: no   No complications documented.   Last Vitals:  Vitals:   03/15/20 0930 03/15/20 0935  BP:  (!) 100/56  Pulse: 73 65  Resp: 18 16  Temp:    SpO2: 94% 94%    Last Pain:  Vitals:   03/15/20 0930  TempSrc:   PainSc: 0-No pain                 Tal Neer K

## 2020-03-18 ENCOUNTER — Encounter: Payer: Self-pay | Admitting: Gastroenterology

## 2020-03-19 LAB — SURGICAL PATHOLOGY

## 2020-12-09 ENCOUNTER — Other Ambulatory Visit: Payer: Self-pay

## 2020-12-09 DIAGNOSIS — Z1231 Encounter for screening mammogram for malignant neoplasm of breast: Secondary | ICD-10-CM

## 2021-01-05 ENCOUNTER — Emergency Department: Payer: BC Managed Care – PPO

## 2021-01-05 ENCOUNTER — Inpatient Hospital Stay
Admission: EM | Admit: 2021-01-05 | Discharge: 2021-01-08 | DRG: 280 | Disposition: A | Discharge status: Home / Self Care | Payer: BC Managed Care – PPO | Attending: Internal Medicine | Admitting: Internal Medicine

## 2021-01-05 ENCOUNTER — Inpatient Hospital Stay (HOSPITAL_COMMUNITY)
Admit: 2021-01-05 | Discharge: 2021-01-05 | Disposition: A | Discharge status: Home / Self Care | Payer: BC Managed Care – PPO | Attending: Cardiology | Admitting: Cardiology

## 2021-01-05 ENCOUNTER — Other Ambulatory Visit: Payer: Self-pay

## 2021-01-05 ENCOUNTER — Encounter: Payer: Self-pay | Admitting: Emergency Medicine

## 2021-01-05 DIAGNOSIS — K760 Fatty (change of) liver, not elsewhere classified: Secondary | ICD-10-CM | POA: Diagnosis present

## 2021-01-05 DIAGNOSIS — E669 Obesity, unspecified: Secondary | ICD-10-CM | POA: Diagnosis present

## 2021-01-05 DIAGNOSIS — R519 Headache, unspecified: Secondary | ICD-10-CM | POA: Diagnosis not present

## 2021-01-05 DIAGNOSIS — G4733 Obstructive sleep apnea (adult) (pediatric): Secondary | ICD-10-CM | POA: Diagnosis present

## 2021-01-05 DIAGNOSIS — J45909 Unspecified asthma, uncomplicated: Secondary | ICD-10-CM | POA: Diagnosis present

## 2021-01-05 DIAGNOSIS — E042 Nontoxic multinodular goiter: Secondary | ICD-10-CM | POA: Diagnosis present

## 2021-01-05 DIAGNOSIS — Z8349 Family history of other endocrine, nutritional and metabolic diseases: Secondary | ICD-10-CM

## 2021-01-05 DIAGNOSIS — N182 Chronic kidney disease, stage 2 (mild): Secondary | ICD-10-CM | POA: Diagnosis present

## 2021-01-05 DIAGNOSIS — Z7951 Long term (current) use of inhaled steroids: Secondary | ICD-10-CM

## 2021-01-05 DIAGNOSIS — I472 Ventricular tachycardia: Secondary | ICD-10-CM | POA: Diagnosis present

## 2021-01-05 DIAGNOSIS — I773 Arterial fibromuscular dysplasia: Secondary | ICD-10-CM

## 2021-01-05 DIAGNOSIS — I214 Non-ST elevation (NSTEMI) myocardial infarction: Secondary | ICD-10-CM | POA: Diagnosis not present

## 2021-01-05 DIAGNOSIS — E785 Hyperlipidemia, unspecified: Secondary | ICD-10-CM | POA: Diagnosis present

## 2021-01-05 DIAGNOSIS — Z79890 Hormone replacement therapy (postmenopausal): Secondary | ICD-10-CM

## 2021-01-05 DIAGNOSIS — Z825 Family history of asthma and other chronic lower respiratory diseases: Secondary | ICD-10-CM | POA: Diagnosis not present

## 2021-01-05 DIAGNOSIS — Z20822 Contact with and (suspected) exposure to covid-19: Secondary | ICD-10-CM | POA: Diagnosis present

## 2021-01-05 DIAGNOSIS — K219 Gastro-esophageal reflux disease without esophagitis: Secondary | ICD-10-CM | POA: Diagnosis present

## 2021-01-05 DIAGNOSIS — R079 Chest pain, unspecified: Secondary | ICD-10-CM | POA: Diagnosis not present

## 2021-01-05 DIAGNOSIS — Z90710 Acquired absence of both cervix and uterus: Secondary | ICD-10-CM | POA: Diagnosis not present

## 2021-01-05 DIAGNOSIS — I2542 Coronary artery dissection: Secondary | ICD-10-CM | POA: Diagnosis present

## 2021-01-05 DIAGNOSIS — R001 Bradycardia, unspecified: Secondary | ICD-10-CM | POA: Diagnosis not present

## 2021-01-05 DIAGNOSIS — Z8501 Personal history of malignant neoplasm of esophagus: Secondary | ICD-10-CM | POA: Diagnosis not present

## 2021-01-05 DIAGNOSIS — I10 Essential (primary) hypertension: Secondary | ICD-10-CM | POA: Diagnosis not present

## 2021-01-05 DIAGNOSIS — Z9851 Tubal ligation status: Secondary | ICD-10-CM

## 2021-01-05 DIAGNOSIS — I2510 Atherosclerotic heart disease of native coronary artery without angina pectoris: Secondary | ICD-10-CM | POA: Diagnosis present

## 2021-01-05 DIAGNOSIS — Z79899 Other long term (current) drug therapy: Secondary | ICD-10-CM | POA: Diagnosis not present

## 2021-01-05 DIAGNOSIS — I129 Hypertensive chronic kidney disease with stage 1 through stage 4 chronic kidney disease, or unspecified chronic kidney disease: Secondary | ICD-10-CM | POA: Diagnosis present

## 2021-01-05 DIAGNOSIS — G4730 Sleep apnea, unspecified: Secondary | ICD-10-CM | POA: Diagnosis present

## 2021-01-05 DIAGNOSIS — E039 Hypothyroidism, unspecified: Secondary | ICD-10-CM | POA: Diagnosis present

## 2021-01-05 DIAGNOSIS — Z8249 Family history of ischemic heart disease and other diseases of the circulatory system: Secondary | ICD-10-CM

## 2021-01-05 DIAGNOSIS — Z6836 Body mass index (BMI) 36.0-36.9, adult: Secondary | ICD-10-CM

## 2021-01-05 LAB — PROTIME-INR
INR: 0.9 (ref 0.8–1.2)
Prothrombin Time: 12.6 seconds (ref 11.4–15.2)

## 2021-01-05 LAB — CBC WITH DIFFERENTIAL/PLATELET
Abs Immature Granulocytes: 0.03 10*3/uL (ref 0.00–0.07)
Basophils Absolute: 0 10*3/uL (ref 0.0–0.1)
Basophils Relative: 0 %
Eosinophils Absolute: 0.2 10*3/uL (ref 0.0–0.5)
Eosinophils Relative: 3 %
HCT: 48.7 % — ABNORMAL HIGH (ref 36.0–46.0)
Hemoglobin: 15.5 g/dL — ABNORMAL HIGH (ref 12.0–15.0)
Immature Granulocytes: 0 %
Lymphocytes Relative: 18 %
Lymphs Abs: 1.4 10*3/uL (ref 0.7–4.0)
MCH: 28.4 pg (ref 26.0–34.0)
MCHC: 31.8 g/dL (ref 30.0–36.0)
MCV: 89.2 fL (ref 80.0–100.0)
Monocytes Absolute: 0.3 10*3/uL (ref 0.1–1.0)
Monocytes Relative: 4 %
Neutro Abs: 5.7 10*3/uL (ref 1.7–7.7)
Neutrophils Relative %: 75 %
Platelets: 204 10*3/uL (ref 150–400)
RBC: 5.46 MIL/uL — ABNORMAL HIGH (ref 3.87–5.11)
RDW: 14.4 % (ref 11.5–15.5)
WBC: 7.6 10*3/uL (ref 4.0–10.5)
nRBC: 0 % (ref 0.0–0.2)

## 2021-01-05 LAB — BASIC METABOLIC PANEL
Anion gap: 4 — ABNORMAL LOW (ref 5–15)
BUN: 14 mg/dL (ref 6–20)
CO2: 26 mmol/L (ref 22–32)
Calcium: 8.4 mg/dL — ABNORMAL LOW (ref 8.9–10.3)
Chloride: 106 mmol/L (ref 98–111)
Creatinine, Ser: 0.95 mg/dL (ref 0.44–1.00)
GFR, Estimated: >60 mL/min (ref >60)
Glucose, Bld: 133 mg/dL — ABNORMAL HIGH (ref 70–99)
Potassium: 3.9 mmol/L (ref 3.5–5.1)
Sodium: 136 mmol/L (ref 135–145)

## 2021-01-05 LAB — LIPID PANEL
Cholesterol: 166 mg/dL (ref 0–200)
HDL: 50 mg/dL (ref >40)
LDL Cholesterol: 102 mg/dL — ABNORMAL HIGH (ref 0–99)
Total CHOL/HDL Ratio: 3.3 RATIO
Triglycerides: 69 mg/dL (ref <150)
VLDL: 14 mg/dL (ref 0–40)

## 2021-01-05 LAB — CBC
HCT: 47.6 % — ABNORMAL HIGH (ref 36.0–46.0)
Hemoglobin: 15.5 g/dL — ABNORMAL HIGH (ref 12.0–15.0)
MCH: 29.4 pg (ref 26.0–34.0)
MCHC: 32.6 g/dL (ref 30.0–36.0)
MCV: 90.3 fL (ref 80.0–100.0)
Platelets: 196 10*3/uL (ref 150–400)
RBC: 5.27 MIL/uL — ABNORMAL HIGH (ref 3.87–5.11)
RDW: 14 % (ref 11.5–15.5)
WBC: 7.8 10*3/uL (ref 4.0–10.5)
nRBC: 0 % (ref 0.0–0.2)

## 2021-01-05 LAB — RESP PANEL BY RT-PCR (FLU A&B, COVID) ARPGX2
Influenza A by PCR: NEGATIVE
Influenza B by PCR: NEGATIVE
SARS Coronavirus 2 by RT PCR: NEGATIVE

## 2021-01-05 LAB — PROCALCITONIN: Procalcitonin: <0.1 ng/mL

## 2021-01-05 LAB — HEPARIN LEVEL (UNFRACTIONATED)
Heparin Unfractionated: >1.1 IU/mL — ABNORMAL HIGH (ref 0.30–0.70)
Heparin Unfractionated: 0.21 IU/mL — ABNORMAL LOW (ref 0.30–0.70)

## 2021-01-05 LAB — MAGNESIUM: Magnesium: 2.2 mg/dL (ref 1.7–2.4)

## 2021-01-05 LAB — APTT: aPTT: 25 seconds (ref 24–36)

## 2021-01-05 LAB — D-DIMER, QUANTITATIVE: D-Dimer, Quant: 0.41 ug/mL-FEU (ref 0.00–0.50)

## 2021-01-05 LAB — TROPONIN I (HIGH SENSITIVITY)
Troponin I (High Sensitivity): 33 ng/L — ABNORMAL HIGH (ref <18)
Troponin I (High Sensitivity): 496 ng/L (ref <18)

## 2021-01-05 MED ORDER — ISOSORBIDE MONONITRATE ER 30 MG PO TB24
30.0000 mg | ORAL_TABLET | Freq: Every day | ORAL | Status: DC
  Administered 2021-01-05 – 2021-01-07 (×2): 30 mg via ORAL
  Filled 2021-01-05 (×2): qty 1

## 2021-01-05 MED ORDER — LEVOTHYROXINE SODIUM 50 MCG PO TABS
50.0000 ug | ORAL_TABLET | Freq: Every day | ORAL | Status: DC
  Administered 2021-01-06 – 2021-01-08 (×3): 50 ug via ORAL
  Filled 2021-01-05 (×3): qty 1

## 2021-01-05 MED ORDER — LEVOTHYROXINE SODIUM 50 MCG PO TABS: 50.0000 ug | ORAL_TABLET | Freq: Every day | ORAL | Status: DC

## 2021-01-05 MED ORDER — ASPIRIN 300 MG RE SUPP: 300.0000 mg | RECTAL | Status: DC

## 2021-01-05 MED ORDER — ALBUTEROL SULFATE (2.5 MG/3ML) 0.083% IN NEBU: 2.5000 mg | INHALATION_SOLUTION | Freq: Four times a day (QID) | RESPIRATORY_TRACT | Status: DC | PRN

## 2021-01-05 MED ORDER — ONDANSETRON HCL 4 MG/2ML IJ SOLN
4.0000 mg | Freq: Four times a day (QID) | INTRAMUSCULAR | Status: DC | PRN
  Administered 2021-01-07 (×2): 4 mg via INTRAVENOUS
  Filled 2021-01-05 (×3): qty 2

## 2021-01-05 MED ORDER — MOMETASONE FURO-FORMOTEROL FUM 200-5 MCG/ACT IN AERO: 2.0000 | INHALATION_SPRAY | Freq: Two times a day (BID) | RESPIRATORY_TRACT | Status: DC

## 2021-01-05 MED ORDER — MORPHINE SULFATE (PF) 4 MG/ML IV SOLN
4.0000 mg | Freq: Once | INTRAVENOUS | Status: AC
Start: 2021-01-05 — End: 2021-01-06
  Administered 2021-01-06: 2 mg via INTRAVENOUS
  Filled 2021-01-05 (×2): qty 1

## 2021-01-05 MED ORDER — ATORVASTATIN CALCIUM 20 MG PO TABS
40.0000 mg | ORAL_TABLET | Freq: Every day | ORAL | Status: DC
  Administered 2021-01-05: 40 mg via ORAL
  Filled 2021-01-05: qty 2

## 2021-01-05 MED ORDER — AZELASTINE HCL 0.1 % NA SOLN
2.0000 | Freq: Two times a day (BID) | NASAL | Status: DC | PRN
Start: 2021-01-05 — End: 2021-01-08
  Filled 2021-01-05: qty 30

## 2021-01-05 MED ORDER — METOPROLOL SUCCINATE ER 50 MG PO TB24
25.0000 mg | ORAL_TABLET | Freq: Every day | ORAL | Status: DC
  Administered 2021-01-05: 25 mg via ORAL
  Filled 2021-01-05: qty 1

## 2021-01-05 MED ORDER — ONDANSETRON HCL 4 MG/2ML IJ SOLN
4.0000 mg | Freq: Once | INTRAMUSCULAR | Status: DC
  Filled 2021-01-05: qty 2

## 2021-01-05 MED ORDER — ASPIRIN 81 MG PO CHEW
324.0000 mg | CHEWABLE_TABLET | Freq: Once | ORAL | Status: AC
  Administered 2021-01-05: 324 mg via ORAL
  Filled 2021-01-05: qty 4

## 2021-01-05 MED ORDER — NITROGLYCERIN 0.4 MG SL SUBL
0.4000 mg | SUBLINGUAL_TABLET | SUBLINGUAL | Status: DC | PRN
  Administered 2021-01-06: 0.4 mg via SUBLINGUAL
  Filled 2021-01-05: qty 1

## 2021-01-05 MED ORDER — HEPARIN (PORCINE) 25000 UT/250ML-% IV SOLN
1150.0000 [IU]/h | INTRAVENOUS | Status: DC
  Administered 2021-01-05: 1000 [IU]/h via INTRAVENOUS
  Administered 2021-01-05: 850 [IU]/h via INTRAVENOUS
  Administered 2021-01-06: 1150 [IU]/h via INTRAVENOUS
  Filled 2021-01-05 (×2): qty 250

## 2021-01-05 MED ORDER — NITROGLYCERIN 2 % TD OINT
0.5000 [in_us] | TOPICAL_OINTMENT | Freq: Once | TRANSDERMAL | Status: AC
  Administered 2021-01-05: 0.5 [in_us] via TOPICAL
  Filled 2021-01-05: qty 1

## 2021-01-05 MED ORDER — ACETAMINOPHEN 325 MG PO TABS
650.0000 mg | ORAL_TABLET | ORAL | Status: DC | PRN
  Administered 2021-01-07 (×2): 650 mg via ORAL
  Filled 2021-01-05 (×2): qty 2

## 2021-01-05 MED ORDER — HEPARIN BOLUS VIA INFUSION
1000.0000 [IU] | Freq: Once | INTRAVENOUS | Status: AC
  Administered 2021-01-05: 1000 [IU] via INTRAVENOUS
  Filled 2021-01-05: qty 1000

## 2021-01-05 MED ORDER — ASPIRIN EC 81 MG PO TBEC
81.0000 mg | DELAYED_RELEASE_TABLET | Freq: Every day | ORAL | Status: DC
  Administered 2021-01-07 – 2021-01-08 (×2): 81 mg via ORAL
  Filled 2021-01-05 (×2): qty 1

## 2021-01-05 MED ORDER — HEPARIN SODIUM (PORCINE) 5000 UNIT/ML IJ SOLN
4000.0000 [IU] | Freq: Once | INTRAMUSCULAR | Status: AC
  Administered 2021-01-05: 4000 [IU] via INTRAVENOUS

## 2021-01-05 MED ORDER — ASPIRIN 81 MG PO CHEW: 324.0000 mg | CHEWABLE_TABLET | ORAL | Status: DC

## 2021-01-05 MED ORDER — PERFLUTREN LIPID MICROSPHERE
1.0000 mL | INTRAVENOUS | Status: AC | PRN
  Administered 2021-01-05: 3 mL via INTRAVENOUS
  Filled 2021-01-05: qty 10

## 2021-01-05 MED ORDER — METOPROLOL TARTRATE 25 MG PO TABS
25.0000 mg | ORAL_TABLET | Freq: Two times a day (BID) | ORAL | Status: DC
  Administered 2021-01-05 – 2021-01-07 (×4): 25 mg via ORAL
  Filled 2021-01-05 (×4): qty 1

## 2021-01-05 NOTE — Consult Note (Signed)
ANTICOAGULATION CONSULT NOTE - Initial Consult  Pharmacy Consult for heparin drip Indication: chest pain/ACS  No Known Allergies  Patient Measurements: Height: '5\' 2"'$  (157.5 cm) Weight: 87.1 kg (192 lb) IBW/kg (Calculated) : 50.1 Heparin Dosing Weight: 70kg  Vital Signs: Temp: 98.3 F (36.8 C) (08/07 0626) BP: 137/78 (08/07 0626) Pulse Rate: 70 (08/07 0626)  Labs: Recent Labs    01/05/21 0602  HGB 15.5*  HCT 47.6*  PLT 196  CREATININE 0.95  TROPONINIHS 33*    Estimated Creatinine Clearance: 65.3 mL/min (by C-G formula based on SCr of 0.95 mg/dL).   Medical History: Past Medical History:  Diagnosis Date   Asthma    Cancer ([place])    esophageal   Chronic kidney disease    CKD - Stage 3   Eczema    Fatty liver    GERD (gastroesophageal reflux disease)    Heart murmur    Hypertension    Hypothyroidism    Multinodular goiter    Sleep apnea     Medications:  No PTA anticoagulation of record  Assessment: 60 yo female with sudden onset L sided chest pain radiating down arm.  Trending troponin's, baseline APTT, INR, and CBC ordered.  Goal of Therapy:  Heparin level 0.3-0.7 units/ml Monitor platelets by anticoagulation protocol: Yes   Plan:  Give 4000 units bolus x 1 Start heparin infusion at 850 units/hr Check anti-Xa level in 6 hours and daily while on heparin Continue to monitor H&H and platelets  Lu Duffel, PharmD, BCPS Clinical Pharmacist 01/05/2021 8:30 AM

## 2021-01-05 NOTE — ED Notes (Signed)
Updated pt on poc, recliner provided for husband, tv remote given, lights dimmed, no other needs at this time.

## 2021-01-05 NOTE — ED Provider Notes (Addendum)
[facility] Emergency Department Provider Note   ____________________________________________   Event Date/Time   First MD Initiated Contact with Patient 01/05/21 [phone]     (approximate)  I have reviewed the triage vital signs and the nursing notes.   HISTORY  Chief Complaint Chest Pain    HPI Lorraine Jimenez is a 60 y.o. female who reports she got up this morning with pain in the left chest left upper chest radiating to left arm.  Pain is severe at least partially pleuritic fluids.  It is not reproduced by palpation.         Past Medical History:  Diagnosis Date   Asthma    Cancer ([place])    esophageal   Chronic kidney disease    CKD - Stage 3   Eczema    Fatty liver    GERD (gastroesophageal reflux disease)    Heart murmur    Hypertension    Hypothyroidism    Multinodular goiter    Sleep apnea     There are no problems to display for this patient.   Past Surgical History:  Procedure Laterality Date   ABDOMINAL HYSTERECTOMY     CESAREAN SECTION     COLONOSCOPY     COLONOSCOPY WITH PROPOFOL N/A 03/15/2020   Procedure: COLONOSCOPY WITH PROPOFOL;  Surgeon: Lesly Rubenstein, MD;  Location: [facility] ENDOSCOPY;  Service: Endoscopy;  Laterality: N/A;   DIAGNOSTIC LAPAROSCOPY     ESOPHAGECTOMY     ESOPHAGOGASTRODUODENOSCOPY (EGD) WITH PROPOFOL N/A 03/15/2020   Procedure: ESOPHAGOGASTRODUODENOSCOPY (EGD) WITH PROPOFOL;  Surgeon: Lesly Rubenstein, MD;  Location: [facility] ENDOSCOPY;  Service: Endoscopy;  Laterality: N/A;   EXPLORATORY LAPAROTOMY     THYROID LOBECTOMY     TUBAL LIGATION     UPPER GI ENDOSCOPY      Prior to Admission medications   Medication Sig Start Date End Date Taking? Authorizing Provider  albuterol (PROVENTIL) (2.5 MG/3ML) 0.083% nebulizer solution Take 2.5 mg by nebulization every 6 (six) hours as needed for wheezing or shortness of breath.    [provider]  albuterol (VENTOLIN HFA) 108 (90 Base) MCG/ACT  inhaler Inhale 2 puffs into the lungs every 6 (six) hours as needed for wheezing or shortness of breath.    [provider]  amLODipine (NORVASC) 10 MG tablet Take 10 mg by mouth daily.    [provider]  azelastine (ASTELIN) 0.1 % nasal spray Place 2 sprays into both nostrils 2 (two) times daily. Use in each nostril as directed    [provider]  budesonide-formoterol (SYMBICORT) 160-4.5 MCG/ACT inhaler Inhale 2 puffs into the lungs 2 (two) times daily.    [provider]  levothyroxine (SYNTHROID) 50 MCG tablet Take 50 mcg by mouth daily before breakfast.    [provider]  losartan (COZAAR) 100 MG tablet Take 100 mg by mouth daily.    [provider]  methocarbamol (ROBAXIN) 500 MG tablet Take 500 mg by mouth 4 (four) times daily. Patient not taking: Reported on 03/15/2020    [provider]  montelukast (SINGULAIR) 10 MG tablet Take 10 mg by mouth at bedtime.    [provider]    Allergies Patient has no known allergies.  Family History  Problem Relation Age of Onset   Hypothyroidism Mother    COPD Mother    Heart disease Mother    Diabetes Mellitus II Mother    Hypertension Mother    Thyroid disease Mother  Leukemia Father    Coronary artery disease Father    Hypertension Sister    Thyroid disease Sister    Breast cancer Neg Hx     Social History Social History   Tobacco Use   Smoking status: Never   Smokeless tobacco: Never  Vaping Use   Vaping Use: Never used  Substance Use Topics   Alcohol use: Yes    Alcohol/week: 1.0 standard drink    Types: 1 Standard drinks or equivalent per week   Drug use: Never    Review of Systems  Constitutional: No fever/chills Eyes: No visual changes. ENT: No sore throat. Cardiovascular: chest pain. Respiratory:  shortness of breath. Gastrointestinal: No abdominal pain.  No nausea, no vomiting.  No diarrhea.  No constipation. Genitourinary: Negative for  dysuria. Musculoskeletal: Negative for back pain. Skin: Negative for rash. Neurological: Negative for headaches, focal weakness  ____________________________________________   PHYSICAL EXAM:  VITAL SIGNS: ED Triage Vitals  Enc Vitals Group     BP 01/05/21 0626 137/78     Pulse Rate 01/05/21 0626 70     Resp 01/05/21 0626 20     Temp 01/05/21 0626 98.3 F (36.8 C)     Temp src --      SpO2 01/05/21 0626 97 %     Weight 01/05/21 0555 192 lb (87.1 kg)     Height 01/05/21 0555 '5\' 2"'$  (1.575 m)     Head Circumference --      Peak Flow --      Pain Score 01/05/21 0555 6     Pain Loc --      Pain Edu? --      Excl. in [place]? --     Constitutional: Alert and oriented.  Patient looks somewhat uncomfortable and scared Eyes: Conjunctivae are normal. PER Head: Atraumatic. Nose: No congestion/rhinnorhea. Mouth/Throat: Mucous membranes are moist.  Oropharynx non-erythematous. Neck: No stridor. Cardiovascular: Normal rate, regular rhythm. Grossly normal heart sounds.  Good peripheral circulation. Respiratory: Normal respiratory effort.  No retractions. Lungs CTAB! Gastrointestinal: Soft and nontender. No distention. No abdominal bruits.  Musculoskeletal: No lower extremity tenderness nor edema.   Neurologic:  Normal speech and language. No gross focal neurologic deficits are appreciated.  Skin:  Skin is warm, dry and intact. No rash noted.   ____________________________________________   LABS (all labs ordered are listed, but only abnormal results are displayed)  Labs Reviewed  BASIC METABOLIC PANEL - Abnormal; Notable for the following components:      Result Value   Glucose, Bld 133 (*)    Calcium 8.4 (*)    Anion gap 4 (*)    All other components within normal limits  CBC - Abnormal; Notable for the following components:   RBC 5.27 (*)    Hemoglobin 15.5 (*)    HCT 47.6 (*)    All other components within normal limits  CBC WITH DIFFERENTIAL/PLATELET - Abnormal; Notable  for the following components:   RBC 5.46 (*)    Hemoglobin 15.5 (*)    HCT 48.7 (*)    All other components within normal limits  TROPONIN I (HIGH SENSITIVITY) - Abnormal; Notable for the following components:   Troponin I (High Sensitivity) 33 (*)    All other components within normal limits  TROPONIN I (HIGH SENSITIVITY) - Abnormal; Notable for the following components:   Troponin I (High Sensitivity) 496 (*)    All other components within normal limits  RESP PANEL BY RT-PCR (FLU A&B, COVID) ARPGX2  D-DIMER, QUANTITATIVE  APTT  PROTIME-INR  PROCALCITONIN  HEPARIN LEVEL (UNFRACTIONATED)   ____________________________________________  EKG  EKG read interpreted by me shows normal sinus rhythm rate of 72 normal axis no acute ST-T wave changes  EKG #2 read interpreted by me shows normal sinus rhythm rate of 70 normal axis again no acute ST-T changes. Monitor strip showed a proximately 8 beat run the nurse reported there were several more beats that he saw that were not captured on the monitor strip of PVCs at a rate of about 60.  There were interspersed P waves which were not associated with a QRS.  Looks more like third-degree AV block than anything.  After that happened the patient's chest pain improved.  Mom was wondering if it was a reperfusion rhythm. ____________________________________________  RADIOLOGY Gertha Calkin, personally viewed and evaluated these images (plain radiographs) as part of my medical decision making, as well as reviewing the written report by the radiologist.  ED MD interpretation: Chest x-ray read by radiology reviewed by me shows a left lower lobe pneumonia and possibly something developing on the right side 2.  Patient interestingly did have similar findings on a chest CT from 2013 apparently related to a what appears to immediately is to be a hiatal hernia..  I do not see any other chest x-rays however.  Official radiology report(s): DG Chest 2  View  Result Date: 01/05/2021 CLINICAL DATA:  60 year old female with history of chest pain and shortness of breath. EXAM: CHEST - 2 VIEW COMPARISON:  No priors. FINDINGS: Extensive airspace consolidation in the left lower lobe. Less dense consolidation also noted in the right lower lobe. Small left parapneumonic pleural effusion. No definite right pleural effusion. No pneumothorax. No evidence of pulmonary edema. Heart size is normal. Upper mediastinal contours are within normal limits. IMPRESSION: 1. Left lower lobe pneumonia with small left parapneumonic pleural effusion. 2. Probable developing airspace consolidation also noted in the right lower lobe. Electronically Signed   By: Vinnie Langton M.D.   On: 01/05/2021 06:36    ____________________________________________   PROCEDURES  Procedure(s) performed (including Critical Care): Medical care time 35 minutes.  This includes talking to the patient and helping the patient get in bed talking to the patient's husband twice talking to the cardiologist twice about the slow V. tach and metoprolol and then notify him about the STEMI I also spoke to the hospitalist and reviewed the patient's old records.  Procedures   ____________________________________________   INITIAL IMPRESSION / ASSESSMENT AND PLAN / ED COURSE  The pain patient's EKG does not look bad and she does have infiltrate on the chest x-ray however she is not coughing or having a fever.  White blood count is not elevated.  Additionally there is a CT from 2013 that looks like there was some consolidation possibly associated with a hiatal hernia or some something similar on the chest CT.  I will check another troponin which is due in the next 15 minutes and another EKG.  We will also add a D-dimer and a procalcitonin.  Is worse with deep breathing.  She has some nausea as well.  She does not have any fever or coughing.  She is a little bit short of breath.     ----------------------------------------- 9:39 AM on 01/05/2021 ----------------------------------------- Patient's troponin level has gone up to 496.  She is already on heparin and is already had aspirin.  We will make sure she gets her Nitropaste.  Cardiology had initially  recommended Toprol-XL I will order that now.  Patient will get upstairs for her NSTEMI.  She is currently pain-free.  Procalcitonin is not back yet patient is still afebrile not coughing and has no particularly abnormal differential either.  I talked to the hospitalist we will get chest CT done.        ____________________________________________   FINAL CLINICAL IMPRESSION(S) / ED DIAGNOSES  Final diagnoses:  NSTEMI (non-ST elevated myocardial infarction) [facility])     ED Discharge Orders     None        Note:  This document was prepared using Dragon voice recognition software and may include unintentional dictation errors.    Nena Polio, MD 01/05/21 [id number]    Nena Polio, MD 01/05/21 1009

## 2021-01-05 NOTE — Consult Note (Signed)
Cardiology Consultation:   Patient ID: DYNASTY MEES MRN: [id number]; DOB: 1960-08-17  Admit date: 01/05/2021 Date of Consult: 01/05/2021  PCP:  Lorraine Jimenez, [place] Providers Cardiologist:  New- [place] rounding Click here to update MD or APP on Care Team, Refresh:1}     Patient Profile:   Lorraine Jimenez is a 60 y.o. female with a hx of hypertension who is being seen 01/05/2021 for the evaluation of chest pain at the request of Dr. Cinda Quest.  History of Present Illness 496.   Lorraine Jimenez is a 60 year old female with history of hypertension, asthma who presents due to left-sided chest pain.  Patient was her usual self yesterday, woke up this morning at about 4 AM due to left-sided chest pain with radiation down her left arm.  She also felt cold and sweaty.  Rates pain as 5 out of 10 initially, 8 out of 10 when she was in the ED.  She denies any prior occurrence of chest discomfort.  She states laying still/flat on the bed improves her pain.  She denies any personal history of heart disease, but notes history of heart attack in her father in his 7s, father was a smoker.  She denies smoking but endorses being around secondhand smoke a good part of her life.  In the ED, EKG showed normal sinus rhythm, initial troponins was 33--->496.  Occasional nonsustained 6-7 beat runs of VT noted on telemetry monitor.  Diagnosed with NSTEMI, started on heparin drip, given aspirin 324 mg.   Past Medical History:  Diagnosis Date   Asthma    Cancer ([place])    esophageal   Chronic kidney disease    CKD - Stage 3   Eczema    Fatty liver    GERD (gastroesophageal reflux disease)    Heart murmur    Hypertension    Hypothyroidism    Multinodular goiter    Sleep apnea     Past Surgical History:  Procedure Laterality Date   ABDOMINAL HYSTERECTOMY     CESAREAN SECTION     COLONOSCOPY     COLONOSCOPY WITH PROPOFOL N/A 03/15/2020   Procedure: COLONOSCOPY WITH PROPOFOL;  Surgeon:  Lesly Rubenstein, MD;  Location: [facility] ENDOSCOPY;  Service: Endoscopy;  Laterality: N/A;   DIAGNOSTIC LAPAROSCOPY     ESOPHAGECTOMY     ESOPHAGOGASTRODUODENOSCOPY (EGD) WITH PROPOFOL N/A 03/15/2020   Procedure: ESOPHAGOGASTRODUODENOSCOPY (EGD) WITH PROPOFOL;  Surgeon: Lesly Rubenstein, MD;  Location: [facility] ENDOSCOPY;  Service: Endoscopy;  Laterality: N/A;   EXPLORATORY LAPAROTOMY     THYROID LOBECTOMY     TUBAL LIGATION     UPPER GI ENDOSCOPY       Home Medications:  Prior to Admission medications   Medication Sig Start Date End Date Taking? Authorizing Provider  albuterol (VENTOLIN HFA) 108 (90 Base) MCG/ACT inhaler Inhale 2 puffs into the lungs every 6 (six) hours as needed for wheezing or shortness of breath.   Yes [provider]  amLODipine (NORVASC) 10 MG tablet Take 10 mg by mouth daily.   Yes [provider]  levothyroxine (SYNTHROID) 50 MCG tablet Take 50 mcg by mouth daily before breakfast.   Yes [provider]  losartan (COZAAR) 100 MG tablet Take 100 mg by mouth daily.   Yes [provider]  albuterol (PROVENTIL) (2.5 MG/3ML) 0.083% nebulizer solution Take 2.5 mg by nebulization every 6 (six) hours as needed for wheezing or shortness of breath. Patient not taking: No sig reported  [provider]  azelastine (ASTELIN) 0.1 % nasal spray Place 2 sprays into both nostrils 2 (two) times daily. Use in each nostril as directed    [provider]  budesonide-formoterol (SYMBICORT) 160-4.5 MCG/ACT inhaler Inhale 2 puffs into the lungs 2 (two) times daily. Patient not taking: No sig reported    [provider]  methocarbamol (ROBAXIN) 500 MG tablet Take 500 mg by mouth 4 (four) times daily. Patient not taking: No sig reported    [provider]  montelukast (SINGULAIR) 10 MG tablet Take 10 mg by mouth at bedtime. Patient not taking: No sig reported    [provider]    Inpatient  Medications: Scheduled Meds:  isosorbide mononitrate  30 mg Oral Daily   metoprolol tartrate  25 mg Oral BID    morphine injection  4 mg Intravenous Once   ondansetron (ZOFRAN) IV  4 mg Intravenous Once   Continuous Infusions:  heparin 850 Units/hr (01/05/21 0843)   PRN Meds:   Allergies:   No Known Allergies  Social History:   Social History   Socioeconomic History   Marital status: Married    Spouse name: Not on file   Number of children: Not on file   Years of education: Not on file   Highest education level: Not on file  Occupational History   Not on file  Tobacco Use   Smoking status: Never   Smokeless tobacco: Never  Vaping Use   Vaping Use: Never used  Substance and Sexual Activity   Alcohol use: Yes    Alcohol/week: 1.0 standard drink    Types: 1 Standard drinks or equivalent per week   Drug use: Never   Sexual activity: Not on file  Other Topics Concern   Not on file  Social History Narrative   Not on file   Social Determinants of Health   Financial Resource Strain: Not on file  Food Insecurity: Not on file  Transportation Needs: Not on file  Physical Activity: Not on file  Stress: Not on file  Social Connections: Not on file  Intimate Partner Violence: Not on file    Family History:    Family History  Problem Relation Age of Onset   Hypothyroidism Mother    COPD Mother    Heart disease Mother    Diabetes Mellitus II Mother    Hypertension Mother    Thyroid disease Mother    Leukemia Father    Coronary artery disease Father    Hypertension Sister    Thyroid disease Sister    Breast cancer Neg Hx      ROS:  Please see the history of present illness.   All other ROS reviewed and negative.     Physical Exam/Data:   Vitals:   01/05/21 0845 01/05/21 1000 01/05/21 1030 01/05/21 1038  BP: 116/72 122/68  126/80  Pulse: 65 (!) 58 78 68  Resp: (!) '21 17 17 17  '$ Temp:      TempSrc:      SpO2: 97% 95% 97% 97%  Weight:      Height:        No intake or output data in the 24 hours ending 01/05/21 1048 Last 3 Weights 01/05/2021 01/05/2021 03/15/2020  Weight (lbs) 198 lb 192 lb 199 lb 6.5 oz  Weight (kg) 89.812 kg 87.091 kg 90.45 kg     Body mass index is 36.21 kg/m.  General:  Well nourished, well developed, in no acute distress HEENT: normal Lymph:  no adenopathy Neck: no JVD Endocrine:  No thryomegaly Vascular: No carotid bruits; FA pulses 2+ bilaterally without bruits  Cardiac:  normal S1, S2; RRR; no murmur  Lungs:  clear to auscultation bilaterally, no wheezing, rhonchi or rales  Abd: soft, nontender, no hepatomegaly  Ext: no edema Musculoskeletal:  No deformities, BUE and BLE strength normal and equal Skin: warm and dry  Neuro:  CNs 2-12 intact, no focal abnormalities noted Psych:  Normal affect   EKG:  The EKG was personally reviewed and demonstrates: Normal sinus rhythm Telemetry:  Telemetry was personally reviewed and demonstrates: Normal sinus rhythm, occasional nonsustained VT lasting 6-7 beats  Relevant CV Studies: Echocardiogram ordered  Laboratory Data:  High Sensitivity Troponin:   Recent Labs  Lab 01/05/21 0602 01/05/21 0826  TROPONINIHS 33* 496*     Chemistry Recent Labs  Lab 01/05/21 0602  NA 136  K 3.9  CL 106  CO2 26  GLUCOSE 133*  BUN 14  CREATININE 0.95  CALCIUM 8.4*  GFRNONAA >60  ANIONGAP 4*    No results for input(s): PROT, ALBUMIN, AST, ALT, ALKPHOS, BILITOT in the last 168 hours. Hematology Recent Labs  Lab 01/05/21 0602  WBC 7.6  7.8  RBC 5.46*  5.27*  HGB 15.5*  15.5*  HCT 48.7*  47.6*  MCV 89.2  90.3  MCH 28.4  29.4  MCHC 31.8  32.6  RDW 14.4  14.0  PLT 204  196   BNPNo results for input(s): BNP, PROBNP in the last 168 hours.  DDimer  Recent Labs  Lab 01/05/21 0826  DDIMER 0.41     Radiology/Studies:  DG Chest 2 View  Result Date: 01/05/2021 CLINICAL DATA:  60 year old female with history of chest pain and shortness of breath. EXAM: CHEST  - 2 VIEW COMPARISON:  No priors. FINDINGS: Extensive airspace consolidation in the left lower lobe. Less dense consolidation also noted in the right lower lobe. Small left parapneumonic pleural effusion. No definite right pleural effusion. No pneumothorax. No evidence of pulmonary edema. Heart size is normal. Upper mediastinal contours are within normal limits. IMPRESSION: 1. Left lower lobe pneumonia with small left parapneumonic pleural effusion. 2. Probable developing airspace consolidation also noted in the right lower lobe. Electronically Signed   By: Vinnie Langton M.D.   On: 01/05/2021 06:36   CT CHEST WO CONTRAST  Result Date: 01/05/2021 CLINICAL DATA:  60 year old female with acute chest pain and shortness of breath. History of distal esophageal resection with gastric pull-through. EXAM: CT CHEST WITHOUT CONTRAST TECHNIQUE: Multidetector CT imaging of the chest was performed following the standard protocol without IV contrast. COMPARISON:  Chest radiograph performed today and 11/05/2011 chest CT FINDINGS: Cardiovascular: UPPER limits normal heart size again noted. There is no evidence of thoracic aortic aneurysm or pericardial effusion. Mediastinum/Nodes: Changes from esophageal surgery and gastric pull-through identified and not significantly changed from 2013. No mediastinal mass or enlarged lymph nodes are identified. Prominent LEFT thyroid is again identified. Lungs/Pleura: Abnormalities on today's chest radiograph represent changes from esophageal surgery/gastric pull-through and herniation of abdominal fat and bowel loops into the posterior thoracic cavity. Mild dependent and basilar atelectasis/scarring again noted. There is no evidence of airspace disease, consolidation, pulmonary mass, pleural effusion or pneumothorax. Is noted. Upper Abdomen: No acute abnormality. Musculoskeletal: No acute or suspicious bony abnormalities are noted. IMPRESSION: 1. No evidence of acute abnormality. 2. Changes  from esophageal surgery/gastric pull-through and herniation of abdominal fat and bowel loops into the posterior thoracic cavity accounting for the  abnormalities identified on chest radiograph today. 3. Mild dependent and basilar atelectasis/scarring. Electronically Signed   By: Margarette Canada M.D.   On: 01/05/2021 10:30     Assessment and Plan:   Chest pain, NSTEMI -Risk factors include hypertension, secondhand smoking exposure, family history of CAD -Aspirin, heparin drip, get echo -Start Lopressor 25 mg twice daily, Imdur 30 mg daily for antianginal benefit. -Continue to trend troponins until peaked.  Obtain fasting lipid profile -Plan for left heart cath tomorrow.  N.p.o. at midnight.  2.  Occasional nonsustained VT 6-7 beats on telemetry -Check magnesium, replete to keep >2, K>4 -Start Lopressor 25 mg twice daily -Echo as above  3.  Hypertension -BP controlled -Lopressor, Imdur for now.  Total encounter time 110 minutes  Greater than 50% was spent in counseling and coordination of care with the patient  Shared Decision Making/Informed Consent The risks [stroke (1 in 1000), death (1 in 1000), kidney failure [usually temporary] (1 in 500), bleeding (1 in 200), allergic reaction [possibly serious] (1 in 200)], benefits (diagnostic support and management of coronary artery disease) and alternatives of a cardiac catheterization were discussed in detail with Ms. Below and she is willing to proceed.   Signed, Kate Sable, MD  01/05/2021 10:48 AM

## 2021-01-05 NOTE — Consult Note (Signed)
ANTICOAGULATION CONSULT NOTE - Initial Consult  Pharmacy Consult for heparin drip Indication: chest pain/ACS  No Known Allergies  Patient Measurements: Height: '5\' 2"'$  (157.5 cm) Weight: 89.8 kg (198 lb) IBW/kg (Calculated) : 50.1 Heparin Dosing Weight: 70kg  Vital Signs: Temp: 98.4 F (36.9 C) (08/07 0829) Temp Source: Oral (08/07 0829) BP: 96/70 (08/07 1545) Pulse Rate: 56 (08/07 1545)  Labs: Recent Labs    01/05/21 0602 01/05/21 0826 01/05/21 1500 01/05/21 1733  HGB 15.5*  15.5*  --   --   --   HCT 48.7*  47.6*  --   --   --   PLT 204  196  --   --   --   APTT  --  25  --   --   LABPROT  --  12.6  --   --   INR  --  0.9  --   --   HEPARINUNFRC  --   --  >1.10* 0.21*  CREATININE 0.95  --   --   --   TROPONINIHS 33* 496*  --   --      Estimated Creatinine Clearance: 66.4 mL/min (by C-G formula based on SCr of 0.95 mg/dL).   Medical History: Past Medical History:  Diagnosis Date   Asthma    Cancer ([place])    esophageal   Chronic kidney disease    CKD - Stage 3   Eczema    Fatty liver    GERD (gastroesophageal reflux disease)    Heart murmur    Hypertension    Hypothyroidism    Multinodular goiter    Sleep apnea     Medications:  No PTA anticoagulation of record  Assessment: 60 yo female with sudden onset L sided chest pain radiating down arm.  Trending troponin's, baseline APTT, INR, and CBC ordered.  8/7 1500 HL > 1.10 --> asked nurse to hold and recollect from other arm 8/7 1733 HL 0.21, subtherapeutic 850 > 1000 units/hr  Goal of Therapy:  Heparin level 0.3-0.7 units/ml Monitor platelets by anticoagulation protocol: Yes   Plan:  8/7 1733 HL 0.21, subtherapeutic Give 1000 units bolus x 1 Increase heparin infusion to 1000 units/hr Check anti-Xa level 6 hours following rate change  Continue to monitor H&H and platelets  Dorothe Pea, PharmD, BCPS Clinical Pharmacist 01/05/2021 6:07 PM

## 2021-01-05 NOTE — H&P (View-Only) (Signed)
Cardiology Consultation:   Patient ID: Lorraine Jimenez MRN: [id number]; DOB: 1960-09-18  Admit date: 01/05/2021 Date of Consult: 01/05/2021  PCP:  Lorraine Jimenez, [place] Providers Cardiologist:  Lorraine Jimenez rounding Click here to update MD or APP on Care Team, Refresh:1}     Patient Profile:   Lorraine Jimenez is a 61 y.o. female with a hx of hypertension who is being seen 01/05/2021 for the evaluation of chest pain at the request of Dr. Cinda Jimenez.  History of Present Illness 496.   Lorraine Jimenez is a 60 year old female with history of hypertension, asthma who presents due to left-sided chest pain.  Patient was her usual self yesterday, woke up this morning at about 4 AM due to left-sided chest pain with radiation down her left arm.  She also felt cold and sweaty.  Rates pain as 5 out of 10 initially, 8 out of 10 when she was in the ED.  She denies any prior occurrence of chest discomfort.  She states laying still/flat on the bed improves her pain.  She denies any personal history of heart disease, but notes history of heart attack in her father in his 77s, father was a smoker.  She denies smoking but endorses being around secondhand smoke a good part of her life.  In the ED, EKG showed normal sinus rhythm, initial troponins was 33--->496.  Occasional nonsustained 6-7 beat runs of VT noted on telemetry monitor.  Diagnosed with NSTEMI, started on heparin drip, given aspirin 324 mg.   Past Medical History:  Diagnosis Date   Asthma    Cancer ([place])    esophageal   Chronic kidney disease    CKD - Stage 3   Eczema    Fatty liver    GERD (gastroesophageal reflux disease)    Heart murmur    Hypertension    Hypothyroidism    Multinodular goiter    Sleep apnea     Past Surgical History:  Procedure Laterality Date   ABDOMINAL HYSTERECTOMY     CESAREAN SECTION     COLONOSCOPY     COLONOSCOPY WITH PROPOFOL N/A 03/15/2020   Procedure: COLONOSCOPY WITH PROPOFOL;  Surgeon:  Lorraine Rubenstein, MD;  Location: [facility] ENDOSCOPY;  Service: Endoscopy;  Laterality: N/A;   DIAGNOSTIC LAPAROSCOPY     ESOPHAGECTOMY     ESOPHAGOGASTRODUODENOSCOPY (EGD) WITH PROPOFOL N/A 03/15/2020   Procedure: ESOPHAGOGASTRODUODENOSCOPY (EGD) WITH PROPOFOL;  Surgeon: Lorraine Rubenstein, MD;  Location: [facility] ENDOSCOPY;  Service: Endoscopy;  Laterality: N/A;   EXPLORATORY LAPAROTOMY     THYROID LOBECTOMY     TUBAL LIGATION     UPPER GI ENDOSCOPY       Home Medications:  Prior to Admission medications   Medication Sig Start Date End Date Taking? Authorizing Provider  albuterol (VENTOLIN HFA) 108 (90 Base) MCG/ACT inhaler Inhale 2 puffs into the lungs every 6 (six) hours as needed for wheezing or shortness of breath.   Yes [provider]  amLODipine (NORVASC) 10 MG tablet Take 10 mg by mouth daily.   Yes [provider]  levothyroxine (SYNTHROID) 50 MCG tablet Take 50 mcg by mouth daily before breakfast.   Yes [provider]  losartan (COZAAR) 100 MG tablet Take 100 mg by mouth daily.   Yes [provider]  albuterol (PROVENTIL) (2.5 MG/3ML) 0.083% nebulizer solution Take 2.5 mg by nebulization every 6 (six) hours as needed for wheezing or shortness of breath. Patient not taking: No sig reported  [provider]  azelastine (ASTELIN) 0.1 % nasal spray Place 2 sprays into both nostrils 2 (two) times daily. Use in each nostril as directed    [provider]  budesonide-formoterol (SYMBICORT) 160-4.5 MCG/ACT inhaler Inhale 2 puffs into the lungs 2 (two) times daily. Patient not taking: No sig reported    [provider]  methocarbamol (ROBAXIN) 500 MG tablet Take 500 mg by mouth 4 (four) times daily. Patient not taking: No sig reported    [provider]  montelukast (SINGULAIR) 10 MG tablet Take 10 mg by mouth at bedtime. Patient not taking: No sig reported    [provider]    Inpatient  Medications: Scheduled Meds:  isosorbide mononitrate  30 mg Oral Daily   metoprolol tartrate  25 mg Oral BID    morphine injection  4 mg Intravenous Once   ondansetron (ZOFRAN) IV  4 mg Intravenous Once   Continuous Infusions:  heparin 850 Units/hr (01/05/21 0843)   PRN Meds:   Allergies:   No Known Allergies  Social History:   Social History   Socioeconomic History   Marital status: Married    Spouse name: Not on file   Number of children: Not on file   Years of education: Not on file   Highest education level: Not on file  Occupational History   Not on file  Tobacco Use   Smoking status: Never   Smokeless tobacco: Never  Vaping Use   Vaping Use: Never used  Substance and Sexual Activity   Alcohol use: Yes    Alcohol/week: 1.0 standard drink    Types: 1 Standard drinks or equivalent per week   Drug use: Never   Sexual activity: Not on file  Other Topics Concern   Not on file  Social History Narrative   Not on file   Social Determinants of Health   Financial Resource Strain: Not on file  Food Insecurity: Not on file  Transportation Needs: Not on file  Physical Activity: Not on file  Stress: Not on file  Social Connections: Not on file  Intimate Partner Violence: Not on file    Family History:    Family History  Problem Relation Age of Onset   Hypothyroidism Mother    COPD Mother    Heart disease Mother    Diabetes Mellitus II Mother    Hypertension Mother    Thyroid disease Mother    Leukemia Father    Coronary artery disease Father    Hypertension Sister    Thyroid disease Sister    Breast cancer Neg Hx      ROS:  Please see the history of present illness.   All other ROS reviewed and negative.     Physical Exam/Data:   Vitals:   01/05/21 0845 01/05/21 1000 01/05/21 1030 01/05/21 1038  BP: 116/72 122/68  126/80  Pulse: 65 (!) 58 78 68  Resp: (!) '21 17 17 17  '$ Temp:      TempSrc:      SpO2: 97% 95% 97% 97%  Weight:      Height:        No intake or output data in the 24 hours ending 01/05/21 1048 Last 3 Weights 01/05/2021 01/05/2021 03/15/2020  Weight (lbs) 198 lb 192 lb 199 lb 6.5 oz  Weight (kg) 89.812 kg 87.091 kg 90.45 kg     Body mass index is 36.21 kg/m.  General:  Well nourished, well developed, in no acute distress HEENT: normal Lymph:  no adenopathy Neck: no JVD Endocrine:  No thryomegaly Vascular: No carotid bruits; FA pulses 2+ bilaterally without bruits  Cardiac:  normal S1, S2; RRR; no murmur  Lungs:  clear to auscultation bilaterally, no wheezing, rhonchi or rales  Abd: soft, nontender, no hepatomegaly  Ext: no edema Musculoskeletal:  No deformities, BUE and BLE strength normal and equal Skin: warm and dry  Neuro:  CNs 2-12 intact, no focal abnormalities noted Psych:  Normal affect   EKG:  The EKG was personally reviewed and demonstrates: Normal sinus rhythm Telemetry:  Telemetry was personally reviewed and demonstrates: Normal sinus rhythm, occasional nonsustained VT lasting 6-7 beats  Relevant CV Studies: Echocardiogram ordered  Laboratory Data:  High Sensitivity Troponin:   Recent Labs  Lab 01/05/21 0602 01/05/21 0826  TROPONINIHS 33* 496*     Chemistry Recent Labs  Lab 01/05/21 0602  NA 136  K 3.9  CL 106  CO2 26  GLUCOSE 133*  BUN 14  CREATININE 0.95  CALCIUM 8.4*  GFRNONAA >60  ANIONGAP 4*    No results for input(s): PROT, ALBUMIN, AST, ALT, ALKPHOS, BILITOT in the last 168 hours. Hematology Recent Labs  Lab 01/05/21 0602  WBC 7.6  7.8  RBC 5.46*  5.27*  HGB 15.5*  15.5*  HCT 48.7*  47.6*  MCV 89.2  90.3  MCH 28.4  29.4  MCHC 31.8  32.6  RDW 14.4  14.0  PLT 204  196   BNPNo results for input(s): BNP, PROBNP in the last 168 hours.  DDimer  Recent Labs  Lab 01/05/21 0826  DDIMER 0.41     Radiology/Studies:  DG Chest 2 View  Result Date: 01/05/2021 CLINICAL DATA:  60 year old female with history of chest pain and shortness of breath. EXAM: CHEST  - 2 VIEW COMPARISON:  No priors. FINDINGS: Extensive airspace consolidation in the left lower lobe. Less dense consolidation also noted in the right lower lobe. Small left parapneumonic pleural effusion. No definite right pleural effusion. No pneumothorax. No evidence of pulmonary edema. Heart size is normal. Upper mediastinal contours are within normal limits. IMPRESSION: 1. Left lower lobe pneumonia with small left parapneumonic pleural effusion. 2. Probable developing airspace consolidation also noted in the right lower lobe. Electronically Signed   By: Vinnie Langton M.D.   On: 01/05/2021 06:36   CT CHEST WO CONTRAST  Result Date: 01/05/2021 CLINICAL DATA:  60 year old female with acute chest pain and shortness of breath. History of distal esophageal resection with gastric pull-through. EXAM: CT CHEST WITHOUT CONTRAST TECHNIQUE: Multidetector CT imaging of the chest was performed following the standard protocol without IV contrast. COMPARISON:  Chest radiograph performed today and 11/05/2011 chest CT FINDINGS: Cardiovascular: UPPER limits normal heart size again noted. There is no evidence of thoracic aortic aneurysm or pericardial effusion. Mediastinum/Nodes: Changes from esophageal surgery and gastric pull-through identified and not significantly changed from 2013. No mediastinal mass or enlarged lymph nodes are identified. Prominent LEFT thyroid is again identified. Lungs/Pleura: Abnormalities on today's chest radiograph represent changes from esophageal surgery/gastric pull-through and herniation of abdominal fat and bowel loops into the posterior thoracic cavity. Mild dependent and basilar atelectasis/scarring again noted. There is no evidence of airspace disease, consolidation, pulmonary mass, pleural effusion or pneumothorax. Is noted. Upper Abdomen: No acute abnormality. Musculoskeletal: No acute or suspicious bony abnormalities are noted. IMPRESSION: 1. No evidence of acute abnormality. 2. Changes  from esophageal surgery/gastric pull-through and herniation of abdominal fat and bowel loops into the posterior thoracic cavity accounting for the  abnormalities identified on chest radiograph today. 3. Mild dependent and basilar atelectasis/scarring. Electronically Signed   By: Margarette Canada M.D.   On: 01/05/2021 10:30     Assessment and Plan:   Chest pain, NSTEMI -Risk factors include hypertension, secondhand smoking exposure, family history of CAD -Aspirin, heparin drip, get echo -Start Lopressor 25 mg twice daily, Imdur 30 mg daily for antianginal benefit. -Continue to trend troponins until peaked.  Obtain fasting lipid profile -Plan for left heart cath tomorrow.  N.p.o. at midnight.  2.  Occasional nonsustained VT 6-7 beats on telemetry -Check magnesium, replete to keep >2, K>4 -Start Lopressor 25 mg twice daily -Echo as above  3.  Hypertension -BP controlled -Lopressor, Imdur for now.  Total encounter time 110 minutes  Greater than 50% was spent in counseling and coordination of care with the patient  Shared Decision Making/Informed Consent The risks [stroke (1 in 1000), death (1 in 1000), kidney failure [usually temporary] (1 in 500), bleeding (1 in 200), allergic reaction [possibly serious] (1 in 200)], benefits (diagnostic support and management of coronary artery disease) and alternatives of a cardiac catheterization were discussed in detail with Ms. Halt and she is willing to proceed.   Signed, Kate Sable, MD  01/05/2021 10:48 AM

## 2021-01-05 NOTE — ED Triage Notes (Signed)
Pt to ED via POV with c/o sudden onset L sided chest pain that is sharp in nature, pt states pain radiates down her L arm.

## 2021-01-05 NOTE — H&P (Signed)
History and Physical    Lorraine Jimenez [id number] DOB: November 21, 1960 DOA: 01/05/2021  PCP: Earlie Counts, FNP   Patient coming from: Home  I have personally briefly reviewed patient's old medical records in [place]  Chief Complaint: Chest pain  HPI: Lorraine Jimenez is a 60 y.o. female with medical history significant for hypertension, esophageal cancer status postsurgery, hypothyroidism and sleep apnea who presents to the ER via private vehicle for evaluation of left-sided chest pain. Patient was in her usual state of health and had gone to bed last night.  She states that she was awakened in the early hours of the morning with chest pain over the left anterior chest wall which she describes as sharp pain and rated 8 x 10 in intensity at its worst pain.  Pain radiated down her left arm and was associated with nausea, shortness of breath and diaphoresis.  She denies having any palpitations. She denies having any cough, no fever, no chills, no abdominal pain, no urinary symptoms, no dizziness, no lightheadedness, no changes in her bowel habits, no headache, no focal deficits or blurred vision. Labs show sodium 136, potassium 3.9, chloride 106, bicarb 26, glucose 133, BUN 14, creatinine 0.95, calcium 8.4, troponin 33 >> 496, procalcitonin less than 0.10, white count 7.6, hemoglobin 15.5, hematocrit 48.7, RDW 14.4, platelet count 204, D-dimer 0.41, PT 12.6, INR 0.9 Respiratory viral panel is negative Chest x-ray reviewed by me shows left lower lobe pneumonia with small left parapneumonic pleural effusion.  Probable developing airspace consolidation noted in the right lower lobe. CT scan of the chest without contrast shows no evidence of acute abnormality.  Changes from esophageal surgery/gastric pull-through and condition of abdominal fat and bowel loops into the posterior thoracic cavity accounting for abnormalities identified on chest x-ray. Twelve-lead EKG reviewed by me shows normal  sinus rhythm   ED Course: Patient is a 60 year old female who presents to the ER for evaluation of chest pain that awakened her from sleep associated with diaphoresis, nausea and radiation to her left arm.  She has a significant family history of coronary artery disease.  Twelve-lead EKG shows no acute findings. Patient bumped her troponin from 33 to 496. She has been started on a heparin drip and cardiology has been consulted. She will be admitted to the hospital for further evaluation.    Review of Systems: As per HPI otherwise all other systems reviewed and negative.    Past Medical History:  Diagnosis Date   Asthma    Cancer ([place])    esophageal   Chronic kidney disease    CKD - Stage 3   Eczema    Fatty liver    GERD (gastroesophageal reflux disease)    Heart murmur    Hypertension    Hypothyroidism    Multinodular goiter    Sleep apnea     Past Surgical History:  Procedure Laterality Date   ABDOMINAL HYSTERECTOMY     CESAREAN SECTION     COLONOSCOPY     COLONOSCOPY WITH PROPOFOL N/A 03/15/2020   Procedure: COLONOSCOPY WITH PROPOFOL;  Surgeon: Lesly Rubenstein, MD;  Location: [facility] ENDOSCOPY;  Service: Endoscopy;  Laterality: N/A;   DIAGNOSTIC LAPAROSCOPY     ESOPHAGECTOMY     ESOPHAGOGASTRODUODENOSCOPY (EGD) WITH PROPOFOL N/A 03/15/2020   Procedure: ESOPHAGOGASTRODUODENOSCOPY (EGD) WITH PROPOFOL;  Surgeon: Lesly Rubenstein, MD;  Location: [facility] ENDOSCOPY;  Service: Endoscopy;  Laterality: N/A;   EXPLORATORY LAPAROTOMY     THYROID LOBECTOMY  TUBAL LIGATION     UPPER GI ENDOSCOPY       reports that she has never smoked. She has never used smokeless tobacco. She reports current alcohol use of about 1.0 standard drink of alcohol per week. She reports that she does not use drugs.  No Known Allergies  Family History  Problem Relation Age of Onset   Hypothyroidism Mother    COPD Mother    Heart disease Mother    Diabetes Mellitus II Mother    Hypertension  Mother    Thyroid disease Mother    Leukemia Father    Coronary artery disease Father    Hypertension Sister    Thyroid disease Sister    Breast cancer Neg Hx       Prior to Admission medications   Medication Sig Start Date End Date Taking? Authorizing Provider  albuterol (VENTOLIN HFA) 108 (90 Base) MCG/ACT inhaler Inhale 2 puffs into the lungs every 6 (six) hours as needed for wheezing or shortness of breath.   Yes [provider]  amLODipine (NORVASC) 10 MG tablet Take 10 mg by mouth daily.   Yes [provider]  levothyroxine (SYNTHROID) 50 MCG tablet Take 50 mcg by mouth daily before breakfast.   Yes [provider]  losartan (COZAAR) 100 MG tablet Take 100 mg by mouth daily.   Yes [provider]  albuterol (PROVENTIL) (2.5 MG/3ML) 0.083% nebulizer solution Take 2.5 mg by nebulization every 6 (six) hours as needed for wheezing or shortness of breath. Patient not taking: No sig reported    [provider]  azelastine (ASTELIN) 0.1 % nasal spray Place 2 sprays into both nostrils 2 (two) times daily. Use in each nostril as directed    [provider]  budesonide-formoterol (SYMBICORT) 160-4.5 MCG/ACT inhaler Inhale 2 puffs into the lungs 2 (two) times daily. Patient not taking: No sig reported    [provider]  methocarbamol (ROBAXIN) 500 MG tablet Take 500 mg by mouth 4 (four) times daily. Patient not taking: No sig reported    [provider]  montelukast (SINGULAIR) 10 MG tablet Take 10 mg by mouth at bedtime. Patient not taking: No sig reported    [provider]    Physical Exam: Vitals:   01/05/21 1030 01/05/21 1038 01/05/21 1051 01/05/21 1115  BP:  126/80 127/84 117/78  Pulse: 78 68 65 62  Resp: '17 17  16  '$ Temp:      TempSrc:      SpO2: 97% 97%  96%  Weight:      Height:         Vitals:   01/05/21 1030 01/05/21 1038 01/05/21 1051 01/05/21 1115  BP:  126/80 127/84 117/78  Pulse: 78  68 65 62  Resp: '17 17  16  '$ Temp:      TempSrc:      SpO2: 97% 97%  96%  Weight:      Height:          Constitutional: Alert and oriented x 3 . Not in any apparent distress.  Appears comfortable and in no distress.  Obese HEENT:      Head: Normocephalic and atraumatic.         Eyes: PERLA, EOMI, Conjunctivae are normal. Sclera is non-icteric.       Mouth/Throat: Mucous membranes are moist.       Neck: Supple with no signs of meningismus. Cardiovascular: Regular rate and rhythm. No murmurs, gallops, or rubs. 2+ symmetrical distal  pulses are present . No JVD. No LE edema Respiratory: Respiratory effort normal .Lungs sounds clear bilaterally. No wheezes, crackles, or rhonchi.  Gastrointestinal: Soft, non tender, and non distended with positive bowel sounds.  Genitourinary: No CVA tenderness. Musculoskeletal: Nontender with normal range of motion in all extremities. No cyanosis, or erythema of extremities. Neurologic:  Face is symmetric. Moving all extremities. No gross focal neurologic deficits . Skin: Skin is warm, dry.  No rash or ulcers Psychiatric: Mood and affect are normal    Labs on Admission: I have personally reviewed following labs and imaging studies  CBC: Recent Labs  Lab 01/05/21 0602  WBC 7.6  7.8  NEUTROABS 5.7  HGB 15.5*  15.5*  HCT 48.7*  47.6*  MCV 89.2  90.3  PLT 204  [id number]   Basic Metabolic Panel: Recent Labs  Lab 01/05/21 0602  NA 136  K 3.9  CL 106  CO2 26  GLUCOSE 133*  BUN 14  CREATININE 0.95  CALCIUM 8.4*   GFR: Estimated Creatinine Clearance: 66.4 mL/min (by C-G formula based on SCr of 0.95 mg/dL). Liver Function Tests: No results for input(s): AST, ALT, ALKPHOS, BILITOT, PROT, ALBUMIN in the last 168 hours. No results for input(s): LIPASE, AMYLASE in the last 168 hours. No results for input(s): AMMONIA in the last 168 hours. Coagulation Profile: Recent Labs  Lab 01/05/21 0826  INR 0.9   Cardiac Enzymes: No results for  input(s): CKTOTAL, CKMB, CKMBINDEX, TROPONINI in the last 168 hours. BNP (last 3 results) No results for input(s): PROBNP in the last 8760 hours. HbA1C: No results for input(s): HGBA1C in the last 72 hours. CBG: No results for input(s): GLUCAP in the last 168 hours. Lipid Profile: Recent Labs    01/05/21 0826  CHOL 166  HDL 50  LDLCALC 102*  TRIG 69  CHOLHDL 3.3   Thyroid Function Tests: No results for input(s): TSH, T4TOTAL, FREET4, T3FREE, THYROIDAB in the last 72 hours. Anemia Panel: No results for input(s): VITAMINB12, FOLATE, FERRITIN, TIBC, IRON, RETICCTPCT in the last 72 hours. Urine analysis:    Component Value Date/Time   COLORURINE Yellow 11/05/2011 1127   APPEARANCEUR Cloudy 11/05/2011 1127   LABSPEC 1.027 11/05/2011 1127   PHURINE 6.0 11/05/2011 1127   GLUCOSEU Negative 11/05/2011 1127   HGBUR 1+ 11/05/2011 1127   BILIRUBINUR Negative 11/05/2011 1127   KETONESUR 1+ 11/05/2011 1127   PROTEINUR 30 mg/dL 11/05/2011 1127   NITRITE Negative 11/05/2011 1127   LEUKOCYTESUR 1+ 11/05/2011 1127    Radiological Exams on Admission: DG Chest 2 View  Result Date: 01/05/2021 CLINICAL DATA:  60 year old female with history of chest pain and shortness of breath. EXAM: CHEST - 2 VIEW COMPARISON:  No priors. FINDINGS: Extensive airspace consolidation in the left lower lobe. Less dense consolidation also noted in the right lower lobe. Small left parapneumonic pleural effusion. No definite right pleural effusion. No pneumothorax. No evidence of pulmonary edema. Heart size is normal. Upper mediastinal contours are within normal limits. IMPRESSION: 1. Left lower lobe pneumonia with small left parapneumonic pleural effusion. 2. Probable developing airspace consolidation also noted in the right lower lobe. Electronically Signed   By: Vinnie Langton M.D.   On: 01/05/2021 06:36   CT CHEST WO CONTRAST  Result Date: 01/05/2021 CLINICAL DATA:  60 year old female with acute chest pain and  shortness of breath. History of distal esophageal resection with gastric pull-through. EXAM: CT CHEST WITHOUT CONTRAST TECHNIQUE: Multidetector CT imaging of the chest was performed following the standard protocol  without IV contrast. COMPARISON:  Chest radiograph performed today and 11/05/2011 chest CT FINDINGS: Cardiovascular: UPPER limits normal heart size again noted. There is no evidence of thoracic aortic aneurysm or pericardial effusion. Mediastinum/Nodes: Changes from esophageal surgery and gastric pull-through identified and not significantly changed from 2013. No mediastinal mass or enlarged lymph nodes are identified. Prominent LEFT thyroid is again identified. Lungs/Pleura: Abnormalities on today's chest radiograph represent changes from esophageal surgery/gastric pull-through and herniation of abdominal fat and bowel loops into the posterior thoracic cavity. Mild dependent and basilar atelectasis/scarring again noted. There is no evidence of airspace disease, consolidation, pulmonary mass, pleural effusion or pneumothorax. Is noted. Upper Abdomen: No acute abnormality. Musculoskeletal: No acute or suspicious bony abnormalities are noted. IMPRESSION: 1. No evidence of acute abnormality. 2. Changes from esophageal surgery/gastric pull-through and herniation of abdominal fat and bowel loops into the posterior thoracic cavity accounting for the abnormalities identified on chest radiograph today. 3. Mild dependent and basilar atelectasis/scarring. Electronically Signed   By: Margarette Canada M.D.   On: 01/05/2021 10:30     Assessment/Plan Principal Problem:   NSTEMI (non-ST elevated myocardial infarction) ([place]) Active Problems:   Primary hypertension   GERD (gastroesophageal reflux disease)   Hypothyroidism   Sleep apnea   Obesity (BMI 30-39.9)     Non-ST elevation MI Patient presents for evaluation of typical chest pain and has a family history of coronary artery disease She has bumped her  troponin from 33 >> 496 We will continue to cycle cardiac enzymes Continue heparin drip initiated in the ER Continue metoprolol, statins and Imdur Obtain 2D echocardiogram to assess LVEF and rule out regional wall motion abnormality Consult cardiology    Hypertension Continue metoprolol and Imdur   Obesity with complications of obstructive sleep apnea (BMI 36) Complicates overall prognosis and care    Hypothyroidism Continue Synthroid  DVT prophylaxis: Heparin Code Status: full code  Family Communication: Greater than 50% of time was spent discussing patient's condition and plan of care with her and her husband at the bedside.  All questions and concerns have been addressed.  They verbalized understanding and agree with the plan. Disposition Plan: Back to previous home environment Consults called: Cardiology Status: At the time of admission, it appears that the appropriate admission status for this patient is inpatient. This is judged to be reasonable and necessary in order to provide the required intensity of service to ensure the patient's safety given the presenting symptoms, physical exam findings, and initial radiographic and laboratory data in the context of their comorbid conditions. Patient requires inpatient status due to high intensity of service, high risk for further deterioration and high frequency of surveillance required.    Collier Bullock MD [facility]     01/05/2021, 11:54 AM

## 2021-01-05 NOTE — Progress Notes (Signed)
*  PRELIMINARY RESULTS* Echocardiogram 2D Echocardiogram has been performed.  Lorraine Jimenez 01/05/2021, 3:12 PM

## 2021-01-06 ENCOUNTER — Other Ambulatory Visit: Payer: Self-pay

## 2021-01-06 ENCOUNTER — Encounter
Admission: EM | Disposition: A | Discharge status: Home / Self Care | Payer: Self-pay | Source: Home / Self Care | Attending: Internal Medicine

## 2021-01-06 DIAGNOSIS — I2510 Atherosclerotic heart disease of native coronary artery without angina pectoris: Secondary | ICD-10-CM

## 2021-01-06 DIAGNOSIS — I214 Non-ST elevation (NSTEMI) myocardial infarction: Secondary | ICD-10-CM | POA: Diagnosis not present

## 2021-01-06 DIAGNOSIS — R001 Bradycardia, unspecified: Secondary | ICD-10-CM

## 2021-01-06 DIAGNOSIS — R079 Chest pain, unspecified: Secondary | ICD-10-CM

## 2021-01-06 HISTORY — PX: LEFT HEART CATH AND CORONARY ANGIOGRAPHY: CATH118249

## 2021-01-06 LAB — LIPID PANEL
Cholesterol: 161 mg/dL (ref 0–200)
HDL: 44 mg/dL (ref >40)
LDL Cholesterol: 99 mg/dL (ref 0–99)
Total CHOL/HDL Ratio: 3.7 RATIO
Triglycerides: 91 mg/dL (ref <150)
VLDL: 18 mg/dL (ref 0–40)

## 2021-01-06 LAB — CBC
HCT: 41.6 % (ref 36.0–46.0)
Hemoglobin: 13.6 g/dL (ref 12.0–15.0)
MCH: 29.6 pg (ref 26.0–34.0)
MCHC: 32.7 g/dL (ref 30.0–36.0)
MCV: 90.6 fL (ref 80.0–100.0)
Platelets: 185 10*3/uL (ref 150–400)
RBC: 4.59 MIL/uL (ref 3.87–5.11)
RDW: 14 % (ref 11.5–15.5)
WBC: 9.2 10*3/uL (ref 4.0–10.5)
nRBC: 0 % (ref 0.0–0.2)

## 2021-01-06 LAB — BASIC METABOLIC PANEL
Anion gap: 9 (ref 5–15)
BUN: 14 mg/dL (ref 6–20)
CO2: 25 mmol/L (ref 22–32)
Calcium: 8.7 mg/dL — ABNORMAL LOW (ref 8.9–10.3)
Chloride: 105 mmol/L (ref 98–111)
Creatinine, Ser: 0.88 mg/dL (ref 0.44–1.00)
GFR, Estimated: >60 mL/min (ref >60)
Glucose, Bld: 97 mg/dL (ref 70–99)
Potassium: 3.6 mmol/L (ref 3.5–5.1)
Sodium: 139 mmol/L (ref 135–145)

## 2021-01-06 LAB — TROPONIN I (HIGH SENSITIVITY)
Troponin I (High Sensitivity): 4626 ng/L (ref <18)
Troponin I (High Sensitivity): 4913 ng/L (ref <18)

## 2021-01-06 LAB — ECHOCARDIOGRAM COMPLETE
AR max vel: 1.62 cm2
AV Area VTI: 1.32 cm2
AV Area mean vel: 1.61 cm2
AV Mean grad: 5 mmHg
AV Peak grad: 8.3 mmHg
Ao pk vel: 1.44 m/s
Area-P 1/2: 4.46 cm2
Height: 62 in
MV VTI: 1.7 cm2
S' Lateral: 2.2 cm
Weight: 3168 oz

## 2021-01-06 LAB — HEPARIN LEVEL (UNFRACTIONATED): Heparin Unfractionated: 0.26 IU/mL — ABNORMAL LOW (ref 0.30–0.70)

## 2021-01-06 LAB — HIV ANTIBODY (ROUTINE TESTING W REFLEX): HIV Screen 4th Generation wRfx: NONREACTIVE

## 2021-01-06 SURGERY — LEFT HEART CATH AND CORONARY ANGIOGRAPHY
Anesthesia: Moderate Sedation

## 2021-01-06 MED ORDER — SODIUM CHLORIDE 0.9 % WEIGHT BASED INFUSION
3.0000 mL/kg/h | INTRAVENOUS | Status: DC
Start: 2021-01-07 — End: 2021-01-06

## 2021-01-06 MED ORDER — HEPARIN (PORCINE) IN NACL 1000-0.9 UT/500ML-% IV SOLN
INTRAVENOUS | Status: AC
  Filled 2021-01-06: qty 1000

## 2021-01-06 MED ORDER — HEPARIN (PORCINE) IN NACL 2000-0.9 UNIT/L-% IV SOLN
INTRAVENOUS | Status: DC | PRN
  Administered 2021-01-06: 1000 mL

## 2021-01-06 MED ORDER — SODIUM CHLORIDE 0.9 % IV SOLN: INTRAVENOUS | Status: AC

## 2021-01-06 MED ORDER — SODIUM CHLORIDE 0.9 % IV SOLN: 250.0000 mL | INTRAVENOUS | Status: DC | PRN

## 2021-01-06 MED ORDER — VERAPAMIL HCL 2.5 MG/ML IV SOLN
INTRAVENOUS | Status: AC
  Filled 2021-01-06: qty 2

## 2021-01-06 MED ORDER — HEPARIN SODIUM (PORCINE) 1000 UNIT/ML IJ SOLN
INTRAMUSCULAR | Status: DC | PRN
  Administered 2021-01-06: 4500 [IU] via INTRAVENOUS

## 2021-01-06 MED ORDER — ATORVASTATIN CALCIUM 20 MG PO TABS
40.0000 mg | ORAL_TABLET | Freq: Every day | ORAL | Status: DC
  Administered 2021-01-07 – 2021-01-08 (×2): 40 mg via ORAL
  Filled 2021-01-06 (×3): qty 2

## 2021-01-06 MED ORDER — VERAPAMIL HCL 2.5 MG/ML IV SOLN
INTRAVENOUS | Status: DC | PRN
  Administered 2021-01-06: 2.5 mg via INTRA_ARTERIAL

## 2021-01-06 MED ORDER — FENTANYL CITRATE (PF) 100 MCG/2ML IJ SOLN
INTRAMUSCULAR | Status: DC | PRN
  Administered 2021-01-06: 25 ug via INTRAVENOUS

## 2021-01-06 MED ORDER — HEPARIN SODIUM (PORCINE) 1000 UNIT/ML IJ SOLN
INTRAMUSCULAR | Status: AC
  Filled 2021-01-06: qty 1

## 2021-01-06 MED ORDER — MORPHINE SULFATE (PF) 2 MG/ML IV SOLN
2.0000 mg | INTRAVENOUS | Status: DC | PRN
Start: 2021-01-06 — End: 2021-01-08

## 2021-01-06 MED ORDER — LABETALOL HCL 5 MG/ML IV SOLN: 10.0000 mg | INTRAVENOUS | Status: AC | PRN

## 2021-01-06 MED ORDER — MIDAZOLAM HCL 2 MG/2ML IJ SOLN
INTRAMUSCULAR | Status: AC
  Filled 2021-01-06: qty 2

## 2021-01-06 MED ORDER — HEPARIN BOLUS VIA INFUSION
1000.0000 [IU] | Freq: Once | INTRAVENOUS | Status: AC
  Administered 2021-01-06: 1000 [IU] via INTRAVENOUS
  Filled 2021-01-06: qty 1000

## 2021-01-06 MED ORDER — SODIUM CHLORIDE 0.9% FLUSH: 3.0000 mL | Freq: Two times a day (BID) | INTRAVENOUS | Status: DC

## 2021-01-06 MED ORDER — SODIUM CHLORIDE 0.9% FLUSH
3.0000 mL | Freq: Two times a day (BID) | INTRAVENOUS | Status: DC
  Administered 2021-01-07 – 2021-01-08 (×2): 3 mL via INTRAVENOUS

## 2021-01-06 MED ORDER — NITROGLYCERIN IN D5W 200-5 MCG/ML-% IV SOLN
0.0000 ug/min | INTRAVENOUS | Status: DC
  Administered 2021-01-06: 10 ug/min via INTRAVENOUS
  Filled 2021-01-06: qty 250

## 2021-01-06 MED ORDER — FENTANYL CITRATE (PF) 100 MCG/2ML IJ SOLN
INTRAMUSCULAR | Status: AC
  Filled 2021-01-06: qty 2

## 2021-01-06 MED ORDER — SODIUM CHLORIDE 0.9% FLUSH: 3.0000 mL | INTRAVENOUS | Status: DC | PRN

## 2021-01-06 MED ORDER — ATORVASTATIN CALCIUM 80 MG PO TABS: 80.0000 mg | ORAL_TABLET | Freq: Every day | ORAL | Status: DC

## 2021-01-06 MED ORDER — LIDOCAINE HCL (PF) 1 % IJ SOLN
INTRAMUSCULAR | Status: DC | PRN
  Administered 2021-01-06: 2 mL

## 2021-01-06 MED ORDER — LIDOCAINE HCL (PF) 1 % IJ SOLN
INTRAMUSCULAR | Status: AC
  Filled 2021-01-06: qty 30

## 2021-01-06 MED ORDER — SODIUM CHLORIDE 0.9 % WEIGHT BASED INFUSION: 1.0000 mL/kg/h | INTRAVENOUS | Status: DC

## 2021-01-06 MED ORDER — MIDAZOLAM HCL 2 MG/2ML IJ SOLN
INTRAMUSCULAR | Status: DC | PRN
  Administered 2021-01-06: 1 mg via INTRAVENOUS

## 2021-01-06 MED ORDER — IOHEXOL 300 MG/ML  SOLN
INTRAMUSCULAR | Status: DC | PRN
  Administered 2021-01-06: 43 mL

## 2021-01-06 SURGICAL SUPPLY — 10 items
CATH INFINITI 5 FR JL3.5 (CATHETERS) ×2 IMPLANT
CATH INFINITI JR4 5F (CATHETERS) ×2 IMPLANT
DEVICE RAD TR BAND REGULAR (VASCULAR PRODUCTS) ×2 IMPLANT
DRAPE BRACHIAL (DRAPES) ×2 IMPLANT
GLIDESHEATH SLEND SS 6F .021 (SHEATH) ×2 IMPLANT
KIT SYRINGE INJ CVI SPIKEX1 (MISCELLANEOUS) ×2 IMPLANT
PACK CARDIAC CATH (CUSTOM PROCEDURE TRAY) ×2 IMPLANT
PROTECTION STATION PRESSURIZED (MISCELLANEOUS) ×2
SET ATX SIMPLICITY (MISCELLANEOUS) ×2 IMPLANT
STATION PROTECTION PRESSURIZED (MISCELLANEOUS) ×1 IMPLANT

## 2021-01-06 NOTE — Consult Note (Signed)
ANTICOAGULATION CONSULT NOTE - Initial Consult  Pharmacy Consult for heparin drip Indication: chest pain/ACS  No Known Allergies  Patient Measurements: Height: '5\' 2"'$  (157.5 cm) Weight: 89.8 kg (198 lb) IBW/kg (Calculated) : 50.1 Heparin Dosing Weight: 70kg  Vital Signs: BP: 125/72 (08/08 0400) Pulse Rate: 65 (08/08 0400)  Labs: Recent Labs    01/05/21 0602 01/05/21 0826 01/05/21 1500 01/05/21 1733 01/06/21 0250 01/06/21 0617  HGB 15.5*  15.5*  --   --   --  13.6  --   HCT 48.7*  47.6*  --   --   --  41.6  --   PLT 204  196  --   --   --  185  --   APTT  --  25  --   --   --   --   LABPROT  --  12.6  --   --   --   --   INR  --  0.9  --   --   --   --   HEPARINUNFRC  --   --  >1.10* 0.21*  --  0.26*  CREATININE 0.95  --   --   --  0.88  --   TROPONINIHS 33* 496*  --   --   --   --      Estimated Creatinine Clearance: 71.7 mL/min (by C-G formula based on SCr of 0.88 mg/dL).   Medical History: Past Medical History:  Diagnosis Date   Asthma    Cancer ([place])    esophageal   Chronic kidney disease    CKD - Stage 3   Eczema    Fatty liver    GERD (gastroesophageal reflux disease)    Heart murmur    Hypertension    Hypothyroidism    Multinodular goiter    Sleep apnea     Medications:  No PTA anticoagulation of record  Assessment: 60 yo female with sudden onset L sided chest pain radiating down arm.  Trending troponin's, baseline APTT, INR, and CBC ordered.  8/7 1500 HL > 1.10 --> asked nurse to hold and recollect from other arm 8/7 1733 HL 0.21, subtherapeutic 850 > 1000 units/hr  Goal of Therapy:  Heparin level 0.3-0.7 units/ml Monitor platelets by anticoagulation protocol: Yes   Plan:  8/8 0617 HL 0.26, subtherapeutic Give 1000 units bolus x 1 Increase heparin infusion to 1150 units/hr Check anti-Xa level 6 hours following rate change  Continue to monitor H&H and platelets  Pearla Dubonnet, PharmD Clinical Pharmacist 01/06/2021 8:14 AM

## 2021-01-06 NOTE — Progress Notes (Signed)
The patient started having substernal chest tightness and nausea similar to her initial presentation.  It was rated as 8 out of 10.  An EKG was done which showed 1 mm of ST elevation in lead III with Q waves.  Does not meet STEMI criteria given that 2 and aVF are normal but definitely different from her presenting EKG. The patient was given 2 sublingual nitroglycerin and 2 mg of morphine sulfate.  I evaluated her at this point and her chest pain was down to 1 out of 10 and improving. I elected to start her on nitroglycerin drip at 10 mcg/h. If she has recurrent chest pain, will recommend relook angiography. I discussed with her and her husband the natural history of SCAD and management options.  Prefer a conservative approach if possible but if she has recurrent ischemia, revascularization might be indicated. Discussed with nursing staff and Dr. Damita Dunnings.

## 2021-01-06 NOTE — Plan of Care (Signed)
Report given to Cath Lab RN and Sherrie George, RN on 2A.  Patient will be going to cath lab - then room 241 after procedure.

## 2021-01-06 NOTE — Progress Notes (Signed)
PROGRESS NOTE    Lorraine Jimenez  [id number] DOB: [birth year] DOA: 01/05/2021 PCP: Earlie Counts, FNP    Brief Narrative:  Lorraine Jimenez is a 60 y.o. female with medical history significant for hypertension, esophageal cancer status postsurgery, hypothyroidism and sleep apnea who presents to the ER via private vehicle for evaluation of left-sided chest pain. Patient was in her usual state of health and had gone to bed last night.  She states that she was awakened in the early hours of the morning with chest pain over the left anterior chest wall which she describes as sharp pain and rated 8 x 10 in intensity at its worst pain.  Pain radiated down her left arm and was associated with nausea, shortness of breath and diaphoresis  CT scan of the chest without contrast shows no evidence of acute abnormality.  Changes from esophageal surgery/gastric pull-through and condition of abdominal fat and bowel loops into the posterior thoracic cavity accounting for abnormalities identified on chest x-ray  Consultants:  cardioilogy  Procedures:   Antimicrobials:      Subjective: No sob or dizziness  Objective: Vitals:   01/06/21 0300 01/06/21 0400 01/06/21 0800 01/06/21 0817  BP: 109/66 125/72 116/78   Pulse: 60 65 (!) 58 73  Resp: '17 13 16 17  '$ Temp:      TempSrc:      SpO2: 98% 99% 99% 100%  Weight:      Height:        Intake/Output Summary (Last 24 hours) at 01/06/2021 0833 Last data filed at 01/05/2021 2235 Gross per 24 hour  Intake 0 ml  Output 1 ml  Net -1 ml   Filed Weights   01/05/21 0555 01/05/21 0834 01/05/21 2235  Weight: 87.1 kg 89.8 kg 89.8 kg    Examination:  General exam: Appears calm and comfortable  Respiratory system: Clear to auscultation. Respiratory effort normal. Cardiovascular system: S1 & S2 heard, RRR. No JVD, murmurs, rubs, gallops or clicks. No pedal edema. Gastrointestinal system: Abdomen is nondistended, soft and nontender. No organomegaly or  masses felt. Normal bowel sounds heard. Central nervous system: Alert and oriented. No focal neurological deficits. Extremities: Symmetric 5 x 5 power. Skin: No rashes, lesions or ulcers Psychiatry: Judgement and insight appear normal. Mood & affect appropriate.     Data Reviewed: I have personally reviewed following labs and imaging studies  CBC: Recent Labs  Lab 01/05/21 0602 01/06/21 0250  WBC 7.6  7.8 9.2  NEUTROABS 5.7  --   HGB 15.5*  15.5* 13.6  HCT 48.7*  47.6* 41.6  MCV 89.2  90.3 90.6  PLT 204  196 [id number]   Basic Metabolic Panel: Recent Labs  Lab 01/05/21 0602 01/05/21 1500 01/06/21 0250  NA 136  --  139  K 3.9  --  3.6  CL 106  --  105  CO2 26  --  25  GLUCOSE 133*  --  97  BUN 14  --  14  CREATININE 0.95  --  0.88  CALCIUM 8.4*  --  8.7*  MG  --  2.2  --    GFR: Estimated Creatinine Clearance: 71.7 mL/min (by C-G formula based on SCr of 0.88 mg/dL). Liver Function Tests: No results for input(s): AST, ALT, ALKPHOS, BILITOT, PROT, ALBUMIN in the last 168 hours. No results for input(s): LIPASE, AMYLASE in the last 168 hours. No results for input(s): AMMONIA in the last 168 hours. Coagulation Profile: Recent Labs  Lab 01/05/21 [phone]  INR 0.9   Cardiac Enzymes: No results for input(s): CKTOTAL, CKMB, CKMBINDEX, TROPONINI in the last 168 hours. BNP (last 3 results) No results for input(s): PROBNP in the last 8760 hours. HbA1C: No results for input(s): HGBA1C in the last 72 hours. CBG: No results for input(s): GLUCAP in the last 168 hours. Lipid Profile: Recent Labs    01/05/21 0826 01/06/21 0250  CHOL 166 161  HDL 50 44  LDLCALC 102* 99  TRIG 69 91  CHOLHDL 3.3 3.7   Thyroid Function Tests: No results for input(s): TSH, T4TOTAL, FREET4, T3FREE, THYROIDAB in the last 72 hours. Anemia Panel: No results for input(s): VITAMINB12, FOLATE, FERRITIN, TIBC, IRON, RETICCTPCT in the last 72 hours. Sepsis Labs: Recent Labs  Lab 01/05/21 0826   PROCALCITON <0.10    Recent Results (from the past 240 hour(s))  Resp Panel by RT-PCR (Flu A&B, Covid) Nasopharyngeal Swab     Status: None   Collection Time: 01/05/21  8:26 AM   Specimen: Nasopharyngeal Swab; Nasopharyngeal(NP) swabs in vial transport medium  Result Value Ref Range Status   SARS Coronavirus 2 by RT PCR NEGATIVE NEGATIVE Final    Comment: (NOTE) SARS-CoV-2 target nucleic acids are NOT DETECTED.  The SARS-CoV-2 RNA is generally detectable in upper respiratory specimens during the acute phase of infection. The lowest concentration of SARS-CoV-2 viral copies this assay can detect is 138 copies/mL. A negative result does not preclude SARS-Cov-2 infection and should not be used as the sole basis for treatment or other patient management decisions. A negative result may occur with  improper specimen collection/handling, submission of specimen other than nasopharyngeal swab, presence of viral mutation(s) within the areas targeted by this assay, and inadequate number of viral copies(<138 copies/mL). A negative result must be combined with clinical observations, patient history, and epidemiological information. The expected result is Negative.  Fact Sheet for Patients:  EntrepreneurPulse.com.au  Fact Sheet for Healthcare Providers:  IncredibleEmployment.be  This test is no t yet approved or cleared by the [place] FDA and  has been authorized for detection and/or diagnosis of SARS-CoV-2 by FDA under an Emergency Use Authorization (EUA). This EUA will remain  in effect (meaning this test can be used) for the duration of the COVID-19 declaration under Section 564(b)(1) of the Act, 21 U.S.C.section 360bbb-3(b)(1), unless the authorization is terminated  or revoked sooner.       Influenza A by PCR NEGATIVE NEGATIVE Final   Influenza B by PCR NEGATIVE NEGATIVE Final    Comment: (NOTE) The Xpert Xpress SARS-CoV-2/FLU/RSV plus  assay is intended as an aid in the diagnosis of influenza from Nasopharyngeal swab specimens and should not be used as a sole basis for treatment. Nasal washings and aspirates are unacceptable for Xpert Xpress SARS-CoV-2/FLU/RSV testing.  Fact Sheet for Patients: EntrepreneurPulse.com.au  Fact Sheet for Healthcare Providers: IncredibleEmployment.be  This test is not yet approved or cleared by the [place] FDA and has been authorized for detection and/or diagnosis of SARS-CoV-2 by FDA under an Emergency Use Authorization (EUA). This EUA will remain in effect (meaning this test can be used) for the duration of the COVID-19 declaration under Section 564(b)(1) of the Act, 21 U.S.C. section 360bbb-3(b)(1), unless the authorization is terminated or revoked.  Performed at [facility], [address], [place], [place] [address]          Radiology Studies: DG Chest 2 View  Result Date: 01/05/2021 CLINICAL DATA:  60 year old female with history of chest pain and shortness of  breath. EXAM: CHEST - 2 VIEW COMPARISON:  No priors. FINDINGS: Extensive airspace consolidation in the left lower lobe. Less dense consolidation also noted in the right lower lobe. Small left parapneumonic pleural effusion. No definite right pleural effusion. No pneumothorax. No evidence of pulmonary edema. Heart size is normal. Upper mediastinal contours are within normal limits. IMPRESSION: 1. Left lower lobe pneumonia with small left parapneumonic pleural effusion. 2. Probable developing airspace consolidation also noted in the right lower lobe. Electronically Signed   By: Vinnie Langton M.D.   On: 01/05/2021 06:36   CT CHEST WO CONTRAST  Result Date: 01/05/2021 CLINICAL DATA:  60 year old female with acute chest pain and shortness of breath. History of distal esophageal resection with gastric pull-through. EXAM: CT CHEST WITHOUT CONTRAST TECHNIQUE: Multidetector CT  imaging of the chest was performed following the standard protocol without IV contrast. COMPARISON:  Chest radiograph performed today and 11/05/2011 chest CT FINDINGS: Cardiovascular: UPPER limits normal heart size again noted. There is no evidence of thoracic aortic aneurysm or pericardial effusion. Mediastinum/Nodes: Changes from esophageal surgery and gastric pull-through identified and not significantly changed from 2013. No mediastinal mass or enlarged lymph nodes are identified. Prominent LEFT thyroid is again identified. Lungs/Pleura: Abnormalities on today's chest radiograph represent changes from esophageal surgery/gastric pull-through and herniation of abdominal fat and bowel loops into the posterior thoracic cavity. Mild dependent and basilar atelectasis/scarring again noted. There is no evidence of airspace disease, consolidation, pulmonary mass, pleural effusion or pneumothorax. Is noted. Upper Abdomen: No acute abnormality. Musculoskeletal: No acute or suspicious bony abnormalities are noted. IMPRESSION: 1. No evidence of acute abnormality. 2. Changes from esophageal surgery/gastric pull-through and herniation of abdominal fat and bowel loops into the posterior thoracic cavity accounting for the abnormalities identified on chest radiograph today. 3. Mild dependent and basilar atelectasis/scarring. Electronically Signed   By: Margarette Canada M.D.   On: 01/05/2021 10:30   ECHOCARDIOGRAM COMPLETE  Result Date: 01/06/2021    ECHOCARDIOGRAM REPORT   Patient Name:   Lorraine Jimenez Date of Exam: 01/05/2021 Medical Rec #:  [id number]         Height:       62.0 in Accession #:    [id number]        Weight:       198.0 lb Date of Birth:  1961-01-13          BSA:          1.904 m Patient Age:    58 years          BP:           103/77 mmHg Patient Gender: F                 HR:           53 bpm. Exam Location:  [facility] Procedure: 2D Echo, Color Doppler, Cardiac Doppler and Intracardiac            Opacification Agent  Indications:     R07.9 Chest Pain  History:         Patient has no prior history of Echocardiogram examinations.                  CKD; Risk Factors:Hypertension and Sleep Apnea. Asthma.  Sonographer:     Charmayne Sheer RDCS (AE) Referring Phys:  [id number] Kate Sable Diagnosing Phys: Kathlyn Sacramento MD  Sonographer Comments: Suboptimal apical window and no subcostal window. IMPRESSIONS  1. Left ventricular ejection fraction, by estimation, is  55 to 60%. The left ventricle has normal function. The left ventricle has no regional wall motion abnormalities. Left ventricular diastolic parameters were normal.  2. Right ventricular systolic function is normal. The right ventricular size is normal. Tricuspid regurgitation signal is inadequate for assessing PA pressure.  3. The mitral valve is normal in structure. Trivial mitral valve regurgitation. No evidence of mitral stenosis.  4. The aortic valve is normal in structure. Aortic valve regurgitation is not visualized. No aortic stenosis is present.  5. The inferior vena cava is normal in size with greater than 50% respiratory variability, suggesting right atrial pressure of 3 mmHg. FINDINGS  Left Ventricle: Left ventricular ejection fraction, by estimation, is 55 to 60%. The left ventricle has normal function. The left ventricle has no regional wall motion abnormalities. Definity contrast agent was given IV to delineate the left ventricular  endocardial borders. The left ventricular internal cavity size was normal in size. There is no left ventricular hypertrophy. Left ventricular diastolic parameters were normal. Right Ventricle: The right ventricular size is normal. No increase in right ventricular wall thickness. Right ventricular systolic function is normal. Tricuspid regurgitation signal is inadequate for assessing PA pressure. Left Atrium: Left atrial size was normal in size. Right Atrium: Right atrial size was normal in size. Pericardium: There is no evidence of  pericardial effusion. Mitral Valve: The mitral valve is normal in structure. Trivial mitral valve regurgitation. No evidence of mitral valve stenosis. MV peak gradient, 2.2 mmHg. The mean mitral valve gradient is 1.0 mmHg. Tricuspid Valve: The tricuspid valve is normal in structure. Tricuspid valve regurgitation is trivial. No evidence of tricuspid stenosis. Aortic Valve: The aortic valve is normal in structure. Aortic valve regurgitation is not visualized. No aortic stenosis is present. Aortic valve mean gradient measures 5.0 mmHg. Aortic valve peak gradient measures 8.3 mmHg. Aortic valve area, by VTI measures 1.32 cm. Pulmonic Valve: The pulmonic valve was normal in structure. Pulmonic valve regurgitation is trivial. No evidence of pulmonic stenosis. Aorta: The aortic root is normal in size and structure. Venous: The inferior vena cava is normal in size with greater than 50% respiratory variability, suggesting right atrial pressure of 3 mmHg. IAS/Shunts: No atrial level shunt detected by color flow Doppler.  LEFT VENTRICLE PLAX 2D LVIDd:         3.50 cm  Diastology LVIDs:         2.20 cm  LV e' medial:    6.31 cm/s LV PW:         1.00 cm  LV E/e' medial:  9.2 LV IVS:        0.80 cm  LV e' lateral:   11.90 cm/s LVOT diam:     1.70 cm  LV E/e' lateral: 4.9 LV SV:         44 LV SV Index:   23 LVOT Area:     2.27 cm  LEFT ATRIUM           Index LA diam:      3.00 cm 1.58 cm/m LA Vol (A4C): 55.0 ml 28.89 ml/m  AORTIC VALVE                   PULMONIC VALVE AV Area (Vmax):    1.62 cm    PV Vmax:       1.23 m/s AV Area (Vmean):   1.61 cm    PV Vmean:      89.400 cm/s AV Area (VTI):     1.32  cm    PV VTI:        0.314 m AV Vmax:           144.00 cm/s PV Peak grad:  6.1 mmHg AV Vmean:          99.900 cm/s PV Mean grad:  4.0 mmHg AV VTI:            0.330 m AV Peak Grad:      8.3 mmHg AV Mean Grad:      5.0 mmHg LVOT Vmax:         103.00 cm/s LVOT Vmean:        70.900 cm/s LVOT VTI:          0.192 m LVOT/AV VTI ratio:  0.58  AORTA Ao Root diam: 2.90 cm MITRAL VALVE MV Area (PHT): 4.46 cm    SHUNTS MV Area VTI:   1.70 cm    Systemic VTI:  0.19 m MV Peak grad:  2.2 mmHg    Systemic Diam: 1.70 cm MV Mean grad:  1.0 mmHg MV Vmax:       0.75 m/s MV Vmean:      37.9 cm/s MV Decel Time: 170 msec MV E velocity: 57.80 cm/s MV A velocity: 64.30 cm/s MV E/A ratio:  0.90 Kathlyn Sacramento MD Electronically signed by Kathlyn Sacramento MD Signature Date/Time: 01/06/2021/8:20:51 AM    Final         Scheduled Meds:  aspirin EC  81 mg Oral Daily   atorvastatin  40 mg Oral Daily   isosorbide mononitrate  30 mg Oral Daily   levothyroxine  50 mcg Oral QAC breakfast   metoprolol tartrate  25 mg Oral BID    morphine injection  4 mg Intravenous Once   ondansetron (ZOFRAN) IV  4 mg Intravenous Once   Continuous Infusions:  heparin 1,150 Units/hr (01/06/21 0810)    Assessment & Plan:   Principal Problem:   NSTEMI (non-ST elevated myocardial infarction) ([place]) Active Problems:   Primary hypertension   GERD (gastroesophageal reflux disease)   Hypothyroidism   Sleep apnea   Obesity (BMI 30-39.9)   Non-ST elevation MI Patient presents for evaluation of typical chest pain and has a family history of coronary artery disease She has bumped her troponin from 33 >> 496 8/8 reported had cp when sitting up in bed this am.  Cards input appreciated Continue iv heparin, asa Start beta blk, imdur , lipitor EF nml , no WMA Plan for LHC today     Hypertension Continue metoprolol and imdur     Obesity with complications of obstructive sleep apnea (BMI 36) Complicates overall px       Hypothyroidism Continue synthroid   DVT prophylaxis: heparin gtt Code Status:full  Family Communication: husband at bedsdie Disposition Plan:  Status is: Inpatient  Remains inpatient appropriate because:Inpatient level of care appropriate due to severity of illness  Dispo: The patient is from: Home              Anticipated d/c is to:  Home              Patient currently is not medically stable to d/c.   Difficult to place patient No            LOS: 1 day   Time spent: 35 min with >50% on coc    Nolberto Hanlon, MD [facility] Pager [phone]  If 7PM-7AM, please contact night-coverage 01/06/2021, 8:33 AM

## 2021-01-06 NOTE — Progress Notes (Addendum)
Cross coverage PROGRESS NOTE   Suzanne Odoherty Nogueira  [id number] DOB: Feb 15, 1961 DOA: 01/05/2021 PCP: Earlie Counts, FNP   Acute event  Request for bedside evaluation Severe chest pain 10 out of 10 s/p cardiac cath earlier not improve with nitroglycerin x1.   Morphine ordered while chart was being reviewed prior to bedside eval  Brief Narrative:  60 y.o. 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  No stent was placed   On my arrival, Dr. Fletcher Anon who performed the cath, was leaving the room.  He had previously been contacted by the nurse.  He stated that patient's pain was down to a 1 out of 10 with the nitroglycerin and morphine. he ordered a nitroglycerin infusion but stated if pain recurred he will take patient back to Cath Lab. Patient was subsequently seen, by which time she stated her pain was still 1/10 but that she had nausea and felt very fatigued.  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 Tyrell Antonio recommendations - Nitroglycerin infusion at 10 mics per hour and angiography if recurrent chest pain. - Dr. Fletcher Anon discussed the possibility of revascularization if recurrent pain. - We will continue to monitor throughout the night    Principal Problem:   NSTEMI (non-ST elevated myocardial infarction) ([place]) Active Problems:   Primary hypertension   GERD (gastroesophageal reflux disease)   Hypothyroidism   Sleep apnea   Obesity (BMI 30-39.9)  Objective: Vitals:   01/06/21 1500 01/06/21 1530 01/06/21 1646 01/06/21 1956  BP: 109/82 (!) 95/55 (!) 101/54 140/74  Pulse: 66 65 64 65   Resp: (!) '22 18 17 18  '$ Temp:   97.9 F (36.6 C) 98.5 F (36.9 C)  TempSrc:      SpO2: 94% 94% 96% 99%  Weight:   89.8 kg   Height:   '5\' 2"'$  (1.575 m)     Intake/Output Summary (Last 24 hours) at 01/06/2021 2130 Last data filed at 01/06/2021 1749 Gross per 24 hour  Intake 360 ml  Output 1 ml  Net 359 ml   Filed Weights   01/05/21 2235 01/06/21 1147 01/06/21 1646  Weight: 89.8 kg 89.8 kg 89.8 kg    Examination:  General exam: Appears a bit anxious Respiratory system: Clear to auscultation. Respiratory effort normal. Cardiovascular system: S1 & S2 heard, RRR. No JVD, murmurs, rubs, gallops or clicks. No pedal edema. Gastrointestinal system: Abdomen is nondistended, soft and nontender. No organomegaly or masses felt. Normal bowel sounds heard. Central nervous system: Alert and oriented. No focal neurological deficits.   Data Reviewed: I have personally reviewed following labs and imaging studies  CBC: Recent Labs  Lab 01/05/21 0602 01/06/21 0250  WBC 7.6  7.8 9.2  NEUTROABS 5.7  --   HGB 15.5*  15.5* 13.6  HCT 48.7*  47.6* 41.6  MCV 89.2  90.3 90.6  PLT 204  196 [id number]   Basic Metabolic Panel: Recent Labs  Lab 01/05/21 0602 01/05/21 1500 01/06/21 0250  NA 136  --  139  K 3.9  --  3.6  CL 106  --  105  CO2 26  --  25  GLUCOSE 133*  --  97  BUN 14  --  14  CREATININE 0.95  --  0.88  CALCIUM 8.4*  --  8.7*  MG  --  2.2  --    GFR: Estimated Creatinine Clearance: 71.7 mL/min (by C-G formula based on SCr of 0.88 mg/dL). Liver Function Tests: No results for input(s): AST, ALT, ALKPHOS, BILITOT, PROT, ALBUMIN in the last 168 hours. No results for input(s): LIPASE, AMYLASE in the last 168 hours. No results for input(s): AMMONIA in the last 168 hours. Coagulation Profile: Recent Labs  Lab 01/05/21 0826  INR 0.9   Cardiac Enzymes: No results for input(s): CKTOTAL, CKMB, CKMBINDEX, TROPONINI in the last 168 hours. BNP (last 3 results) No results for  input(s): PROBNP in the last 8760 hours. HbA1C: No results for input(s): HGBA1C in the last 72 hours. CBG: No results for input(s): GLUCAP in the last 168 hours. Lipid Profile: Recent Labs    01/05/21 0826 01/06/21 0250  CHOL 166 161  HDL 50 44  LDLCALC 102* 99  TRIG 69 91  CHOLHDL 3.3 3.7   Thyroid Function Tests: No results for input(s): TSH, T4TOTAL, FREET4, T3FREE, THYROIDAB in the last 72 hours. Anemia Panel: No results for input(s): VITAMINB12, FOLATE, FERRITIN, TIBC, IRON, RETICCTPCT in the last 72 hours. Sepsis Labs: Recent Labs  Lab 01/05/21 0826  PROCALCITON <0.10    Recent Results (from the past 240 hour(s))  Resp Panel by RT-PCR (Flu A&B, Covid) Nasopharyngeal Swab     Status: None   Collection Time: 01/05/21  8:26 AM   Specimen: Nasopharyngeal Swab; Nasopharyngeal(NP) swabs in vial transport medium  Result Value Ref Range Status   SARS Coronavirus 2 by RT PCR NEGATIVE NEGATIVE Final    Comment: (NOTE) SARS-CoV-2 target nucleic acids are NOT DETECTED.  The SARS-CoV-2 RNA is generally detectable in upper respiratory specimens during the acute phase of infection. The lowest concentration of SARS-CoV-2 viral copies this assay can detect is 138 copies/mL. A negative result does not preclude SARS-Cov-2 infection and should not be used as the sole basis for treatment or other patient management decisions. A negative result may occur with  improper specimen collection/handling, submission of specimen other than nasopharyngeal swab, presence of viral mutation(s) within the areas targeted by this assay, and inadequate number of viral copies(<138 copies/mL). A negative result must be combined with clinical observations, patient history, and epidemiological information. The expected result is Negative.  Fact Sheet for Patients:  EntrepreneurPulse.com.au  Fact Sheet for Healthcare Providers:  IncredibleEmployment.be  This test  is no t yet approved or cleared by the [place] FDA and  has been authorized for detection and/or diagnosis of SARS-CoV-2 by FDA under an Emergency Use Authorization (EUA). This EUA will remain  in effect (meaning this test can be used) for the duration of the COVID-19 declaration under Section 564(b)(1) of the Act, 21 U.S.C.section 360bbb-3(b)(1), unless the authorization is terminated  or revoked sooner.       Influenza A by PCR NEGATIVE NEGATIVE Final   Influenza B by PCR NEGATIVE NEGATIVE Final    Comment: (NOTE) The Xpert Xpress SARS-CoV-2/FLU/RSV plus assay is intended as an aid in the diagnosis of influenza from Nasopharyngeal swab specimens and should not be used as a sole basis for treatment. Nasal washings and aspirates are unacceptable for Xpert Xpress SARS-CoV-2/FLU/RSV testing.  Fact Sheet for Patients: EntrepreneurPulse.com.au  Fact Sheet for Healthcare Providers: IncredibleEmployment.be  This test is not yet approved or cleared by the [place] and has been authorized for  detection and/or diagnosis of SARS-CoV-2 by FDA under an Emergency Use Authorization (EUA). This EUA will remain in effect (meaning this test can be used) for the duration of the COVID-19 declaration under Section 564(b)(1) of the Act, 21 U.S.C. section 360bbb-3(b)(1), unless the authorization is terminated or revoked.  Performed at [facility], [address] [place], [place] [address]          Radiology Studies: DG Chest 2 View  Result Date: 01/05/2021 CLINICAL DATA:  60 year old female with history of chest pain and shortness of breath. EXAM: CHEST - 2 VIEW COMPARISON:  No priors. FINDINGS: Extensive airspace consolidation in the left lower lobe. Less dense consolidation also noted in the right lower lobe. Small left parapneumonic pleural effusion. No definite right pleural effusion. No pneumothorax. No evidence of pulmonary  edema. Heart size is normal. Upper mediastinal contours are within normal limits. IMPRESSION: 1. Left lower lobe pneumonia with small left parapneumonic pleural effusion. 2. Probable developing airspace consolidation also noted in the right lower lobe. Electronically Signed   By: Vinnie Langton M.D.   On: 01/05/2021 06:36   CT CHEST WO CONTRAST  Result Date: 01/05/2021 CLINICAL DATA:  60 year old female with acute chest pain and shortness of breath. History of distal esophageal resection with gastric pull-through. EXAM: CT CHEST WITHOUT CONTRAST TECHNIQUE: Multidetector CT imaging of the chest was performed following the standard protocol without IV contrast. COMPARISON:  Chest radiograph performed today and 11/05/2011 chest CT FINDINGS: Cardiovascular: UPPER limits normal heart size again noted. There is no evidence of thoracic aortic aneurysm or pericardial effusion. Mediastinum/Nodes: Changes from esophageal surgery and gastric pull-through identified and not significantly changed from 2013. No mediastinal mass or enlarged lymph nodes are identified. Prominent LEFT thyroid is again identified. Lungs/Pleura: Abnormalities on today's chest radiograph represent changes from esophageal surgery/gastric pull-through and herniation of abdominal fat and bowel loops into the posterior thoracic cavity. Mild dependent and basilar atelectasis/scarring again noted. There is no evidence of airspace disease, consolidation, pulmonary mass, pleural effusion or pneumothorax. Is noted. Upper Abdomen: No acute abnormality. Musculoskeletal: No acute or suspicious bony abnormalities are noted. IMPRESSION: 1. No evidence of acute abnormality. 2. Changes from esophageal surgery/gastric pull-through and herniation of abdominal fat and bowel loops into the posterior thoracic cavity accounting for the abnormalities identified on chest radiograph today. 3. Mild dependent and basilar atelectasis/scarring. Electronically Signed   By:  Margarette Canada M.D.   On: 01/05/2021 10:30   CARDIAC CATHETERIZATION  Result Date: 01/06/2021 Formatting of this result is different from the original.   Dist RCA lesion is 80% stenosed.   The left ventricular systolic function is normal.   LV end diastolic pressure is mildly elevated.   The left ventricular ejection fraction is greater than 65% by visual estimate.   There is no aortic valve stenosis. 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 RECOMMENDATIONS: Medical management of likely SCAD with at least 1 year of antiplatelet therapy.  If she has recurrent chest discomfort or exertional chest discomfort in the future, we can consider repeat angiography in 6 weeks though this may not be necessary. Ambulatory referral to cardiac rehab placed.   ECHOCARDIOGRAM COMPLETE  Result Date: 01/06/2021    ECHOCARDIOGRAM REPORT   Patient Name:   LEVAEH GOYETTE Pingleton Date of Exam: 01/05/2021 Medical Rec #:  [id number]         Height:  62.0 in Accession #:    [id number]        Weight:       198.0 lb Date of Birth:  08-08-1960          BSA:          1.904 m Patient Age:    10 years          BP:           103/77 mmHg Patient Gender: F                 HR:           53 bpm. Exam Location:  [facility] Procedure: 2D Echo, Color Doppler, Cardiac Doppler and Intracardiac            Opacification Agent Indications:     R07.9 Chest Pain  History:         Patient has no prior history of Echocardiogram examinations.                  CKD; Risk Factors:Hypertension and Sleep Apnea. Asthma.  Sonographer:     Charmayne Sheer RDCS (AE) Referring Phys:  [id number] Kate Sable Diagnosing Phys: Kathlyn Sacramento MD  Sonographer Comments: Suboptimal apical window and no subcostal window. IMPRESSIONS  1. Left ventricular ejection fraction, by estimation, is 55 to 60%. The left ventricle has normal function. The left ventricle has no regional wall  motion abnormalities. Left ventricular diastolic parameters were normal.  2. Right ventricular systolic function is normal. The right ventricular size is normal. Tricuspid regurgitation signal is inadequate for assessing PA pressure.  3. The mitral valve is normal in structure. Trivial mitral valve regurgitation. No evidence of mitral stenosis.  4. The aortic valve is normal in structure. Aortic valve regurgitation is not visualized. No aortic stenosis is present.  5. The inferior vena cava is normal in size with greater than 50% respiratory variability, suggesting right atrial pressure of 3 mmHg. FINDINGS  Left Ventricle: Left ventricular ejection fraction, by estimation, is 55 to 60%. The left ventricle has normal function. The left ventricle has no regional wall motion abnormalities. Definity contrast agent was given IV to delineate the left ventricular  endocardial borders. The left ventricular internal cavity size was normal in size. There is no left ventricular hypertrophy. Left ventricular diastolic parameters were normal. Right Ventricle: The right ventricular size is normal. No increase in right ventricular wall thickness. Right ventricular systolic function is normal. Tricuspid regurgitation signal is inadequate for assessing PA pressure. Left Atrium: Left atrial size was normal in size. Right Atrium: Right atrial size was normal in size. Pericardium: There is no evidence of pericardial effusion. Mitral Valve: The mitral valve is normal in structure. Trivial mitral valve regurgitation. No evidence of mitral valve stenosis. MV peak gradient, 2.2 mmHg. The mean mitral valve gradient is 1.0 mmHg. Tricuspid Valve: The tricuspid valve is normal in structure. Tricuspid valve regurgitation is trivial. No evidence of tricuspid stenosis. Aortic Valve: The aortic valve is normal in structure. Aortic valve regurgitation is not visualized. No aortic stenosis is present. Aortic valve mean gradient measures 5.0 mmHg.  Aortic valve peak gradient measures 8.3 mmHg. Aortic valve area, by VTI measures 1.32 cm. Pulmonic Valve: The pulmonic valve was normal in structure. Pulmonic valve regurgitation is trivial. No evidence of pulmonic stenosis. Aorta: The aortic root is normal in size and structure. Venous: The inferior vena cava is normal in size with greater than 50% respiratory variability, suggesting right atrial pressure  of 3 mmHg. IAS/Shunts: No atrial level shunt detected by color flow Doppler.  LEFT VENTRICLE PLAX 2D LVIDd:         3.50 cm  Diastology LVIDs:         2.20 cm  LV e' medial:    6.31 cm/s LV PW:         1.00 cm  LV E/e' medial:  9.2 LV IVS:        0.80 cm  LV e' lateral:   11.90 cm/s LVOT diam:     1.70 cm  LV E/e' lateral: 4.9 LV SV:         44 LV SV Index:   23 LVOT Area:     2.27 cm  LEFT ATRIUM           Index LA diam:      3.00 cm 1.58 cm/m LA Vol (A4C): 55.0 ml 28.89 ml/m  AORTIC VALVE                   PULMONIC VALVE AV Area (Vmax):    1.62 cm    PV Vmax:       1.23 m/s AV Area (Vmean):   1.61 cm    PV Vmean:      89.400 cm/s AV Area (VTI):     1.32 cm    PV VTI:        0.314 m AV Vmax:           144.00 cm/s PV Peak grad:  6.1 mmHg AV Vmean:          99.900 cm/s PV Mean grad:  4.0 mmHg AV VTI:            0.330 m AV Peak Grad:      8.3 mmHg AV Mean Grad:      5.0 mmHg LVOT Vmax:         103.00 cm/s LVOT Vmean:        70.900 cm/s LVOT VTI:          0.192 m LVOT/AV VTI ratio: 0.58  AORTA Ao Root diam: 2.90 cm MITRAL VALVE MV Area (PHT): 4.46 cm    SHUNTS MV Area VTI:   1.70 cm    Systemic VTI:  0.19 m MV Peak grad:  2.2 mmHg    Systemic Diam: 1.70 cm MV Mean grad:  1.0 mmHg MV Vmax:       0.75 m/s MV Vmean:      37.9 cm/s MV Decel Time: 170 msec MV E velocity: 57.80 cm/s MV A velocity: 64.30 cm/s MV E/A ratio:  0.90 Kathlyn Sacramento MD Electronically signed by Kathlyn Sacramento MD Signature Date/Time: 01/06/2021/8:20:51 AM    Final         Scheduled Meds:  aspirin EC  81 mg Oral Daily   [START ON  01/07/2021] atorvastatin  40 mg Oral Daily   isosorbide mononitrate  30 mg Oral Daily   levothyroxine  50 mcg Oral QAC breakfast   metoprolol tartrate  25 mg Oral BID   ondansetron (ZOFRAN) IV  4 mg Intravenous Once   sodium chloride flush  3 mL Intravenous Q12H   Continuous Infusions:  sodium chloride     nitroGLYCERIN 10 mcg/min (01/06/21 2113)     LOS: 1 day    Time spent: [place], MD [facility]

## 2021-01-06 NOTE — Interval H&P Note (Signed)
Cath Lab Visit (complete for each Cath Lab visit)  Clinical Evaluation Leading to the Procedure:   ACS: Yes.    Non-ACS:  n/a    History and Physical Interval Note:  01/06/2021 1:03 PM  Lorraine Jimenez  has presented today for surgery, with the diagnosis of Non-ST segment myocardial infarction.  The various methods of treatment have been discussed with the patient and family. After consideration of risks, benefits and other options for treatment, the patient has consented to  Procedure(s): LEFT HEART CATH AND CORONARY ANGIOGRAPHY (N/A) as a surgical intervention.  The patient's history has been reviewed, patient examined, no change in status, stable for surgery.  I have reviewed the patient's chart and labs.  Questions were answered to the patient's satisfaction.     Kathlyn Sacramento

## 2021-01-06 NOTE — Progress Notes (Signed)
Progress Note  Patient Name: Lorraine Jimenez Date of Encounter: 01/06/2021  [facility] Cardiologist: New  Subjective   Patient reports chest pain this morning worse with sitting up, currently chest pain free. Plan for LHC this AM. No further NSVT appreciated on tele.   Inpatient Medications    Scheduled Meds:  aspirin EC  81 mg Oral Daily   atorvastatin  40 mg Oral Daily   isosorbide mononitrate  30 mg Oral Daily   levothyroxine  50 mcg Oral QAC breakfast   metoprolol tartrate  25 mg Oral BID    morphine injection  4 mg Intravenous Once   ondansetron (ZOFRAN) IV  4 mg Intravenous Once   Continuous Infusions:  heparin 1,150 Units/hr (01/06/21 0810)   PRN Meds: acetaminophen, albuterol, azelastine, nitroGLYCERIN, ondansetron (ZOFRAN) IV   Vital Signs    Vitals:   01/06/21 0300 01/06/21 0400 01/06/21 0800 01/06/21 0817  BP: 109/66 125/72 116/78   Pulse: 60 65 (!) 58 73  Resp: '17 13 16 17  '$ Temp:      TempSrc:      SpO2: 98% 99% 99% 100%  Weight:      Height:        Intake/Output Summary (Last 24 hours) at 01/06/2021 0938 Last data filed at 01/05/2021 2235 Gross per 24 hour  Intake 0 ml  Output 1 ml  Net -1 ml   Last 3 Weights 01/05/2021 01/05/2021 01/05/2021  Weight (lbs) 198 lb 198 lb 192 lb  Weight (kg) 89.812 kg 89.812 kg 87.091 kg      Telemetry    Nsr, HR 50-60s, rare PVCs - Personally Reviewed  ECG    NSR, 72bpm, TWO aVL- Personally Reviewed  Physical Exam   GEN: No acute distress.   Neck: No JVD Cardiac: RRR, no murmurs, rubs, or gallops.  Respiratory: Clear to auscultation bilaterally. GI: Soft, nontender, non-distended  MS: No edema; No deformity. Neuro:  Nonfocal  Psych: Normal affect   Labs    High Sensitivity Troponin:   Recent Labs  Lab 01/05/21 0602 01/05/21 0826  TROPONINIHS 33* 496*      Chemistry Recent Labs  Lab 01/05/21 0602 01/06/21 0250  NA 136 139  K 3.9 3.6  CL 106 105  CO2 26 25  GLUCOSE 133* 97  BUN 14 14   CREATININE 0.95 0.88  CALCIUM 8.4* 8.7*  GFRNONAA >60 >60  ANIONGAP 4* 9     Hematology Recent Labs  Lab 01/05/21 0602 01/06/21 0250  WBC 7.6  7.8 9.2  RBC 5.46*  5.27* 4.59  HGB 15.5*  15.5* 13.6  HCT 48.7*  47.6* 41.6  MCV 89.2  90.3 90.6  MCH 28.4  29.4 29.6  MCHC 31.8  32.6 32.7  RDW 14.4  14.0 14.0  PLT 204  196 185    BNPNo results for input(s): BNP, PROBNP in the last 168 hours.   DDimer  Recent Labs  Lab 01/05/21 0826  DDIMER 0.41     Radiology    DG Chest 2 View  Result Date: 01/05/2021 CLINICAL DATA:  60 year old female with history of chest pain and shortness of breath. EXAM: CHEST - 2 VIEW COMPARISON:  No priors. FINDINGS: Extensive airspace consolidation in the left lower lobe. Less dense consolidation also noted in the right lower lobe. Small left parapneumonic pleural effusion. No definite right pleural effusion. No pneumothorax. No evidence of pulmonary edema. Heart size is normal. Upper mediastinal contours are within normal limits. IMPRESSION: 1. Left lower  lobe pneumonia with small left parapneumonic pleural effusion. 2. Probable developing airspace consolidation also noted in the right lower lobe. Electronically Signed   By: Vinnie Langton M.D.   On: 01/05/2021 06:36   CT CHEST WO CONTRAST  Result Date: 01/05/2021 CLINICAL DATA:  60 year old female with acute chest pain and shortness of breath. History of distal esophageal resection with gastric pull-through. EXAM: CT CHEST WITHOUT CONTRAST TECHNIQUE: Multidetector CT imaging of the chest was performed following the standard protocol without IV contrast. COMPARISON:  Chest radiograph performed today and 11/05/2011 chest CT FINDINGS: Cardiovascular: UPPER limits normal heart size again noted. There is no evidence of thoracic aortic aneurysm or pericardial effusion. Mediastinum/Nodes: Changes from esophageal surgery and gastric pull-through identified and not significantly changed from 2013. No  mediastinal mass or enlarged lymph nodes are identified. Prominent LEFT thyroid is again identified. Lungs/Pleura: Abnormalities on today's chest radiograph represent changes from esophageal surgery/gastric pull-through and herniation of abdominal fat and bowel loops into the posterior thoracic cavity. Mild dependent and basilar atelectasis/scarring again noted. There is no evidence of airspace disease, consolidation, pulmonary mass, pleural effusion or pneumothorax. Is noted. Upper Abdomen: No acute abnormality. Musculoskeletal: No acute or suspicious bony abnormalities are noted. IMPRESSION: 1. No evidence of acute abnormality. 2. Changes from esophageal surgery/gastric pull-through and herniation of abdominal fat and bowel loops into the posterior thoracic cavity accounting for the abnormalities identified on chest radiograph today. 3. Mild dependent and basilar atelectasis/scarring. Electronically Signed   By: Margarette Canada M.D.   On: 01/05/2021 10:30   ECHOCARDIOGRAM COMPLETE  Result Date: 01/06/2021    ECHOCARDIOGRAM REPORT   Patient Name:   Lorraine Jimenez Date of Exam: 01/05/2021 Medical Rec #:  [id number]         Height:       62.0 in Accession #:    [id number]        Weight:       198.0 lb Date of Birth:  Jul 04, 1960          BSA:          1.904 m Patient Age:    77 years          BP:           103/77 mmHg Patient Gender: F                 HR:           53 bpm. Exam Location:  [facility] Procedure: 2D Echo, Color Doppler, Cardiac Doppler and Intracardiac            Opacification Agent Indications:     R07.9 Chest Pain  History:         Patient has no prior history of Echocardiogram examinations.                  CKD; Risk Factors:Hypertension and Sleep Apnea. Asthma.  Sonographer:     Charmayne Sheer RDCS (AE) Referring Phys:  [id number] Kate Sable Diagnosing Phys: Kathlyn Sacramento MD  Sonographer Comments: Suboptimal apical window and no subcostal window. IMPRESSIONS  1. Left ventricular ejection fraction, by  estimation, is 55 to 60%. The left ventricle has normal function. The left ventricle has no regional wall motion abnormalities. Left ventricular diastolic parameters were normal.  2. Right ventricular systolic function is normal. The right ventricular size is normal. Tricuspid regurgitation signal is inadequate for assessing PA pressure.  3. The mitral valve is normal in structure. Trivial mitral valve regurgitation. No  evidence of mitral stenosis.  4. The aortic valve is normal in structure. Aortic valve regurgitation is not visualized. No aortic stenosis is present.  5. The inferior vena cava is normal in size with greater than 50% respiratory variability, suggesting right atrial pressure of 3 mmHg. FINDINGS  Left Ventricle: Left ventricular ejection fraction, by estimation, is 55 to 60%. The left ventricle has normal function. The left ventricle has no regional wall motion abnormalities. Definity contrast agent was given IV to delineate the left ventricular  endocardial borders. The left ventricular internal cavity size was normal in size. There is no left ventricular hypertrophy. Left ventricular diastolic parameters were normal. Right Ventricle: The right ventricular size is normal. No increase in right ventricular wall thickness. Right ventricular systolic function is normal. Tricuspid regurgitation signal is inadequate for assessing PA pressure. Left Atrium: Left atrial size was normal in size. Right Atrium: Right atrial size was normal in size. Pericardium: There is no evidence of pericardial effusion. Mitral Valve: The mitral valve is normal in structure. Trivial mitral valve regurgitation. No evidence of mitral valve stenosis. MV peak gradient, 2.2 mmHg. The mean mitral valve gradient is 1.0 mmHg. Tricuspid Valve: The tricuspid valve is normal in structure. Tricuspid valve regurgitation is trivial. No evidence of tricuspid stenosis. Aortic Valve: The aortic valve is normal in structure. Aortic valve  regurgitation is not visualized. No aortic stenosis is present. Aortic valve mean gradient measures 5.0 mmHg. Aortic valve peak gradient measures 8.3 mmHg. Aortic valve area, by VTI measures 1.32 cm. Pulmonic Valve: The pulmonic valve was normal in structure. Pulmonic valve regurgitation is trivial. No evidence of pulmonic stenosis. Aorta: The aortic root is normal in size and structure. Venous: The inferior vena cava is normal in size with greater than 50% respiratory variability, suggesting right atrial pressure of 3 mmHg. IAS/Shunts: No atrial level shunt detected by color flow Doppler.  LEFT VENTRICLE PLAX 2D LVIDd:         3.50 cm  Diastology LVIDs:         2.20 cm  LV e' medial:    6.31 cm/s LV PW:         1.00 cm  LV E/e' medial:  9.2 LV IVS:        0.80 cm  LV e' lateral:   11.90 cm/s LVOT diam:     1.70 cm  LV E/e' lateral: 4.9 LV SV:         44 LV SV Index:   23 LVOT Area:     2.27 cm  LEFT ATRIUM           Index LA diam:      3.00 cm 1.58 cm/m LA Vol (A4C): 55.0 ml 28.89 ml/m  AORTIC VALVE                   PULMONIC VALVE AV Area (Vmax):    1.62 cm    PV Vmax:       1.23 m/s AV Area (Vmean):   1.61 cm    PV Vmean:      89.400 cm/s AV Area (VTI):     1.32 cm    PV VTI:        0.314 m AV Vmax:           144.00 cm/s PV Peak grad:  6.1 mmHg AV Vmean:          99.900 cm/s PV Mean grad:  4.0 mmHg AV VTI:  0.330 m AV Peak Grad:      8.3 mmHg AV Mean Grad:      5.0 mmHg LVOT Vmax:         103.00 cm/s LVOT Vmean:        70.900 cm/s LVOT VTI:          0.192 m LVOT/AV VTI ratio: 0.58  AORTA Ao Root diam: 2.90 cm MITRAL VALVE MV Area (PHT): 4.46 cm    SHUNTS MV Area VTI:   1.70 cm    Systemic VTI:  0.19 m MV Peak grad:  2.2 mmHg    Systemic Diam: 1.70 cm MV Mean grad:  1.0 mmHg MV Vmax:       0.75 m/s MV Vmean:      37.9 cm/s MV Decel Time: 170 msec MV E velocity: 57.80 cm/s MV A velocity: 64.30 cm/s MV E/A ratio:  0.90 Kathlyn Sacramento MD Electronically signed by Kathlyn Sacramento MD Signature  Date/Time: 01/06/2021/8:20:51 AM    Final     Cardiac Studies   Echo 01/05/21 1. Left ventricular ejection fraction, by estimation, is 55 to 60%. The  left ventricle has normal function. The left ventricle has no regional  wall motion abnormalities. Left ventricular diastolic parameters were  normal.   2. Right ventricular systolic function is normal. The right ventricular  size is normal. Tricuspid regurgitation signal is inadequate for assessing  PA pressure.   3. The mitral valve is normal in structure. Trivial mitral valve  regurgitation. No evidence of mitral stenosis.   4. The aortic valve is normal in structure. Aortic valve regurgitation is  not visualized. No aortic stenosis is present.   5. The inferior vena cava is normal in size with greater than 50%  respiratory variability, suggesting right atrial pressure of 3 mmHg.   Patient Profile     60 y.o. female with h/o HTN, esophageal cancer s/p postsurgery, hypothyroidism, sleep apnea who is being seen for NSTEMI.   Assessment & Plan    NSTEMI - no significant prior cardiac history. RF include HTN, family history - presented with left sided chest pain, HS troponin elevated 33>496 - IV heparin - given ASA '324mg'$ , now no ASA '81mg'$  daily - started on Lopressor '25mg'$  daily, Imdur '30mg'$  daily, lipitor  - echo showed LVEF 50-60%, no WMA, trivial MR - plan for LHC today  NSVT - started on lopressor - continue tele - no significant runs seen ont ele  HTN - started on lopressor '25mg'$  daily, Imdur '30mg'$  daily - Monitor with HR in the 50s. Bps good.   HLD - LDL 99, goal<70 - started on atorvastatin '40mg'$  daily, will increase to '80mg'$  dialy   For questions or updates, please contact [place] Please consult [email or web address] for contact info under        Signed, Shameeka Silliman Ninfa Meeker, PA-C  01/06/2021, 9:38 AM

## 2021-01-07 ENCOUNTER — Encounter: Payer: Self-pay | Admitting: Cardiology

## 2021-01-07 DIAGNOSIS — I214 Non-ST elevation (NSTEMI) myocardial infarction: Secondary | ICD-10-CM | POA: Diagnosis not present

## 2021-01-07 LAB — CBC
HCT: 43.2 % (ref 36.0–46.0)
Hemoglobin: 14.4 g/dL (ref 12.0–15.0)
MCH: 29.9 pg (ref 26.0–34.0)
MCHC: 33.3 g/dL (ref 30.0–36.0)
MCV: 89.8 fL (ref 80.0–100.0)
Platelets: 193 10*3/uL (ref 150–400)
RBC: 4.81 MIL/uL (ref 3.87–5.11)
RDW: 13.6 % (ref 11.5–15.5)
WBC: 10.6 10*3/uL — ABNORMAL HIGH (ref 4.0–10.5)
nRBC: 0 % (ref 0.0–0.2)

## 2021-01-07 LAB — BASIC METABOLIC PANEL
Anion gap: 8 (ref 5–15)
BUN: 11 mg/dL (ref 6–20)
CO2: 28 mmol/L (ref 22–32)
Calcium: 9 mg/dL (ref 8.9–10.3)
Chloride: 101 mmol/L (ref 98–111)
Creatinine, Ser: 0.93 mg/dL (ref 0.44–1.00)
GFR, Estimated: >60 mL/min (ref >60)
Glucose, Bld: 105 mg/dL — ABNORMAL HIGH (ref 70–99)
Potassium: 4.5 mmol/L (ref 3.5–5.1)
Sodium: 137 mmol/L (ref 135–145)

## 2021-01-07 LAB — TROPONIN I (HIGH SENSITIVITY)
Troponin I (High Sensitivity): 3783 ng/L (ref <18)
Troponin I (High Sensitivity): 4227 ng/L (ref <18)

## 2021-01-07 MED ORDER — HEPARIN SODIUM (PORCINE) 5000 UNIT/ML IJ SOLN
5000.0000 [IU] | Freq: Three times a day (TID) | INTRAMUSCULAR | Status: DC
  Administered 2021-01-07 – 2021-01-08 (×4): 5000 [IU] via SUBCUTANEOUS
  Filled 2021-01-07 (×4): qty 1

## 2021-01-07 NOTE — Progress Notes (Signed)
PROGRESS NOTE    Emra Livshits Harty  [id number] DOB: [birth year] DOA: 01/05/2021 PCP: Earlie Counts, FNP    Brief Narrative:  Lorraine Jimenez is a 60 y.o. female with medical history significant for hypertension, esophageal cancer status postsurgery, hypothyroidism and sleep apnea who presents to the ER via private vehicle for evaluation of left-sided chest pain. Patient was in her usual state of health and had gone to bed last night.  She states that she was awakened in the early hours of the morning with chest pain over the left anterior chest wall which she describes as sharp pain and rated 8 x 10 in intensity at its worst pain.  Pain radiated down her left arm and was associated with nausea, shortness of breath and diaphoresis  CT scan of the chest without contrast shows no evidence of acute abnormality.  Changes from esophageal surgery/gastric pull-through and condition of abdominal fat and bowel loops into the posterior thoracic cavity accounting for abnormalities identified on chest x-ray  Cardiology was consulted.  Patient underwent cardiac cath without intervention.  Overnight she had chest pain and was started on nitro drip.  8/9 no chest pain this AM however is on nitro drip.  Husband at bedside.  Complaining of headache and took Tylenol.   Consultants:  Cardiology  Procedures:  Cardiac cath   Antimicrobials:      Subjective: No shortness of breath or dizziness, no abdominal pain  Objective: Vitals:   01/07/21 0400 01/07/21 0500 01/07/21 0600 01/07/21 0700  BP:  127/73 136/71 128/75  Pulse:  64 83 64  Resp:  '20 18 19  '$ Temp: 98.3 F (36.8 C)     TempSrc: Oral     SpO2:  95% 97% 95%  Weight:      Height:        Intake/Output Summary (Last 24 hours) at 01/07/2021 1312 Last data filed at 01/07/2021 0950 Gross per 24 hour  Intake 840 ml  Output --  Net 840 ml   Filed Weights   01/05/21 2235 01/06/21 1147 01/06/21 1646  Weight: 89.8 kg 89.8 kg 89.8 kg     Examination: NAD, calm CTA no wheeze rales rhonchi's Regular S1-S2 no gallops Soft benign positive bowel sounds No edema aaxox4, grossly intact   Data Reviewed: I have personally reviewed following labs and imaging studies  CBC: Recent Labs  Lab 01/05/21 0602 01/06/21 0250  WBC 7.6  7.8 9.2  NEUTROABS 5.7  --   HGB 15.5*  15.5* 13.6  HCT 48.7*  47.6* 41.6  MCV 89.2  90.3 90.6  PLT 204  196 [id number]   Basic Metabolic Panel: Recent Labs  Lab 01/05/21 0602 01/05/21 1500 01/06/21 0250  NA 136  --  139  K 3.9  --  3.6  CL 106  --  105  CO2 26  --  25  GLUCOSE 133*  --  97  BUN 14  --  14  CREATININE 0.95  --  0.88  CALCIUM 8.4*  --  8.7*  MG  --  2.2  --    GFR: Estimated Creatinine Clearance: 71.7 mL/min (by C-G formula based on SCr of 0.88 mg/dL). Liver Function Tests: No results for input(s): AST, ALT, ALKPHOS, BILITOT, PROT, ALBUMIN in the last 168 hours. No results for input(s): LIPASE, AMYLASE in the last 168 hours. No results for input(s): AMMONIA in the last 168 hours. Coagulation Profile: Recent Labs  Lab 01/05/21 0826  INR 0.9   Cardiac Enzymes: No  results for input(s): CKTOTAL, CKMB, CKMBINDEX, TROPONINI in the last 168 hours. BNP (last 3 results) No results for input(s): PROBNP in the last 8760 hours. HbA1C: No results for input(s): HGBA1C in the last 72 hours. CBG: No results for input(s): GLUCAP in the last 168 hours. Lipid Profile: Recent Labs    01/05/21 0826 01/06/21 0250  CHOL 166 161  HDL 50 44  LDLCALC 102* 99  TRIG 69 91  CHOLHDL 3.3 3.7   Thyroid Function Tests: No results for input(s): TSH, T4TOTAL, FREET4, T3FREE, THYROIDAB in the last 72 hours. Anemia Panel: No results for input(s): VITAMINB12, FOLATE, FERRITIN, TIBC, IRON, RETICCTPCT in the last 72 hours. Sepsis Labs: Recent Labs  Lab 01/05/21 0826  PROCALCITON <0.10    Recent Results (from the past 240 hour(s))  Resp Panel by RT-PCR (Flu A&B, Covid)  Nasopharyngeal Swab     Status: None   Collection Time: 01/05/21  8:26 AM   Specimen: Nasopharyngeal Swab; Nasopharyngeal(NP) swabs in vial transport medium  Result Value Ref Range Status   SARS Coronavirus 2 by RT PCR NEGATIVE NEGATIVE Final    Comment: (NOTE) SARS-CoV-2 target nucleic acids are NOT DETECTED.  The SARS-CoV-2 RNA is generally detectable in upper respiratory specimens during the acute phase of infection. The lowest concentration of SARS-CoV-2 viral copies this assay can detect is 138 copies/mL. A negative result does not preclude SARS-Cov-2 infection and should not be used as the sole basis for treatment or other patient management decisions. A negative result may occur with  improper specimen collection/handling, submission of specimen other than nasopharyngeal swab, presence of viral mutation(s) within the areas targeted by this assay, and inadequate number of viral copies(<138 copies/mL). A negative result must be combined with clinical observations, patient history, and epidemiological information. The expected result is Negative.  Fact Sheet for Patients:  EntrepreneurPulse.com.au  Fact Sheet for Healthcare Providers:  IncredibleEmployment.be  This test is no t yet approved or cleared by the [place] FDA and  has been authorized for detection and/or diagnosis of SARS-CoV-2 by FDA under an Emergency Use Authorization (EUA). This EUA will remain  in effect (meaning this test can be used) for the duration of the COVID-19 declaration under Section 564(b)(1) of the Act, 21 U.S.C.section 360bbb-3(b)(1), unless the authorization is terminated  or revoked sooner.       Influenza A by PCR NEGATIVE NEGATIVE Final   Influenza B by PCR NEGATIVE NEGATIVE Final    Comment: (NOTE) The Xpert Xpress SARS-CoV-2/FLU/RSV plus assay is intended as an aid in the diagnosis of influenza from Nasopharyngeal swab specimens and should not be  used as a sole basis for treatment. Nasal washings and aspirates are unacceptable for Xpert Xpress SARS-CoV-2/FLU/RSV testing.  Fact Sheet for Patients: EntrepreneurPulse.com.au  Fact Sheet for Healthcare Providers: IncredibleEmployment.be  This test is not yet approved or cleared by the [place] FDA and has been authorized for detection and/or diagnosis of SARS-CoV-2 by FDA under an Emergency Use Authorization (EUA). This EUA will remain in effect (meaning this test can be used) for the duration of the COVID-19 declaration under Section 564(b)(1) of the Act, 21 U.S.C. section 360bbb-3(b)(1), unless the authorization is terminated or revoked.  Performed at [facility], [address], [place], [place] [address]          Radiology Studies: CARDIAC CATHETERIZATION  Result Date: 01/06/2021 Formatting of this result is different from the original.   Dist RCA lesion is 80% stenosed.   The left ventricular  systolic function is normal.   LV end diastolic pressure is mildly elevated.   The left ventricular ejection fraction is greater than 65% by visual estimate.   There is no aortic valve stenosis. 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 RECOMMENDATIONS: Medical management of likely SCAD with at least 1 year of antiplatelet therapy.  If she has recurrent chest discomfort or exertional chest discomfort in the future, we can consider repeat angiography in 6 weeks though this may not be necessary. Ambulatory referral to cardiac rehab placed.   ECHOCARDIOGRAM COMPLETE  Result Date: 01/06/2021    ECHOCARDIOGRAM REPORT   Patient Name:   JUDAH DEVOLL Colleran Date of Exam: 01/05/2021 Medical Rec #:  [id number]         Height:       62.0 in Accession #:    [id number]        Weight:       198.0 lb Date of Birth:  07-08-1960          BSA:           1.904 m Patient Age:    15 years          BP:           103/77 mmHg Patient Gender: F                 HR:           53 bpm. Exam Location:  [facility] Procedure: 2D Echo, Color Doppler, Cardiac Doppler and Intracardiac            Opacification Agent Indications:     R07.9 Chest Pain  History:         Patient has no prior history of Echocardiogram examinations.                  CKD; Risk Factors:Hypertension and Sleep Apnea. Asthma.  Sonographer:     Charmayne Sheer RDCS (AE) Referring Phys:  [id number] Kate Sable Diagnosing Phys: Kathlyn Sacramento MD  Sonographer Comments: Suboptimal apical window and no subcostal window. IMPRESSIONS  1. Left ventricular ejection fraction, by estimation, is 55 to 60%. The left ventricle has normal function. The left ventricle has no regional wall motion abnormalities. Left ventricular diastolic parameters were normal.  2. Right ventricular systolic function is normal. The right ventricular size is normal. Tricuspid regurgitation signal is inadequate for assessing PA pressure.  3. The mitral valve is normal in structure. Trivial mitral valve regurgitation. No evidence of mitral stenosis.  4. The aortic valve is normal in structure. Aortic valve regurgitation is not visualized. No aortic stenosis is present.  5. The inferior vena cava is normal in size with greater than 50% respiratory variability, suggesting right atrial pressure of 3 mmHg. FINDINGS  Left Ventricle: Left ventricular ejection fraction, by estimation, is 55 to 60%. The left ventricle has normal function. The left ventricle has no regional wall motion abnormalities. Definity contrast agent was given IV to delineate the left ventricular  endocardial borders. The left ventricular internal cavity size was normal in size. There is no left ventricular hypertrophy. Left ventricular diastolic parameters were normal. Right Ventricle: The right ventricular size is normal. No increase in right ventricular wall thickness. Right  ventricular systolic function is normal. Tricuspid regurgitation signal is inadequate for assessing PA pressure. Left Atrium: Left atrial size was normal in size. Right Atrium: Right atrial size was normal in  size. Pericardium: There is no evidence of pericardial effusion. Mitral Valve: The mitral valve is normal in structure. Trivial mitral valve regurgitation. No evidence of mitral valve stenosis. MV peak gradient, 2.2 mmHg. The mean mitral valve gradient is 1.0 mmHg. Tricuspid Valve: The tricuspid valve is normal in structure. Tricuspid valve regurgitation is trivial. No evidence of tricuspid stenosis. Aortic Valve: The aortic valve is normal in structure. Aortic valve regurgitation is not visualized. No aortic stenosis is present. Aortic valve mean gradient measures 5.0 mmHg. Aortic valve peak gradient measures 8.3 mmHg. Aortic valve area, by VTI measures 1.32 cm. Pulmonic Valve: The pulmonic valve was normal in structure. Pulmonic valve regurgitation is trivial. No evidence of pulmonic stenosis. Aorta: The aortic root is normal in size and structure. Venous: The inferior vena cava is normal in size with greater than 50% respiratory variability, suggesting right atrial pressure of 3 mmHg. IAS/Shunts: No atrial level shunt detected by color flow Doppler.  LEFT VENTRICLE PLAX 2D LVIDd:         3.50 cm  Diastology LVIDs:         2.20 cm  LV e' medial:    6.31 cm/s LV PW:         1.00 cm  LV E/e' medial:  9.2 LV IVS:        0.80 cm  LV e' lateral:   11.90 cm/s LVOT diam:     1.70 cm  LV E/e' lateral: 4.9 LV SV:         44 LV SV Index:   23 LVOT Area:     2.27 cm  LEFT ATRIUM           Index LA diam:      3.00 cm 1.58 cm/m LA Vol (A4C): 55.0 ml 28.89 ml/m  AORTIC VALVE                   PULMONIC VALVE AV Area (Vmax):    1.62 cm    PV Vmax:       1.23 m/s AV Area (Vmean):   1.61 cm    PV Vmean:      89.400 cm/s AV Area (VTI):     1.32 cm    PV VTI:        0.314 m AV Vmax:           144.00 cm/s PV Peak grad:   6.1 mmHg AV Vmean:          99.900 cm/s PV Mean grad:  4.0 mmHg AV VTI:            0.330 m AV Peak Grad:      8.3 mmHg AV Mean Grad:      5.0 mmHg LVOT Vmax:         103.00 cm/s LVOT Vmean:        70.900 cm/s LVOT VTI:          0.192 m LVOT/AV VTI ratio: 0.58  AORTA Ao Root diam: 2.90 cm MITRAL VALVE MV Area (PHT): 4.46 cm    SHUNTS MV Area VTI:   1.70 cm    Systemic VTI:  0.19 m MV Peak grad:  2.2 mmHg    Systemic Diam: 1.70 cm MV Mean grad:  1.0 mmHg MV Vmax:       0.75 m/s MV Vmean:      37.9 cm/s MV Decel Time: 170 msec MV E velocity: 57.80 cm/s MV A velocity: 64.30 cm/s MV E/A ratio:  0.90  Kathlyn Sacramento MD Electronically signed by Kathlyn Sacramento MD Signature Date/Time: 01/06/2021/8:20:51 AM    Final         Scheduled Meds:  aspirin EC  81 mg Oral Daily   atorvastatin  40 mg Oral Daily   isosorbide mononitrate  30 mg Oral Daily   levothyroxine  50 mcg Oral QAC breakfast   metoprolol tartrate  25 mg Oral BID   ondansetron (ZOFRAN) IV  4 mg Intravenous Once   sodium chloride flush  3 mL Intravenous Q12H   Continuous Infusions:  sodium chloride      Assessment & Plan:   Principal Problem:   NSTEMI (non-ST elevated myocardial infarction) ([place]) Active Problems:   Primary hypertension   GERD (gastroesophageal reflux disease)   Hypothyroidism   Sleep apnea   Obesity (BMI 30-39.9)   Non-ST elevation MI Patient presents for evaluation of typical chest pain and has a family history of coronary artery disease She has bumped her troponin from 33 >> 496 8/9 cardiology was consulted patient underwent left heart cath without intervention.  Overnight patient had chest tightness, EKG did not meet criteria for STEMI.  She was started on nitro drip by cardiology.  Plan was if she has recurrent chest pain was going to be taken back to Cath Lab. This a.m. on nitro drip without chest pain.  Spoke to cardiology via chat they were okay with discontinuing nitro drip and they will reevaluate  her. Continue aspirin, statin, beta-blocker, Imdur Discontinue nitro drip Further management per cardiology      Hypertension Stable, continue metoprolol and Imdur     Obesity with complications of obstructive sleep apnea (BMI 36) Overall complicates prognosis and care       Hypothyroidism Continue Synthroid   DVT prophylaxis: heparin Code Status:full  Family Communication: husband at bedsdie Disposition Plan:  Status is: Inpatient  Remains inpatient appropriate because:Inpatient level of care appropriate due to severity of illness  Dispo: The patient is from: Home              Anticipated d/c is to: Home              Patient currently is not medically stable to d/c.   Difficult to place patient No            LOS: 2 days   Time spent: 35 min with >50% on coc    Nolberto Hanlon, MD [facility] Pager [phone]  If 7PM-7AM, please contact night-coverage 01/07/2021, 1:12 PM

## 2021-01-07 NOTE — Progress Notes (Signed)
Progress Note  Patient Name: Lorraine Jimenez Date of Encounter: 01/07/2021  [facility] Cardiologist: New- Agbor-Etang  Subjective   Overnight had chest pain and was given SL NTG x2. She was subsequently started on the Nitro gtt.   Patient has since been weaned off the Nitr gtt. No further chest pain. Right radial site stable. Cath showed SCAD.   Inpatient Medications    Scheduled Meds:  aspirin EC  81 mg Oral Daily   atorvastatin  40 mg Oral Daily   heparin  5,000 Units Subcutaneous Q8H   isosorbide mononitrate  30 mg Oral Daily   levothyroxine  50 mcg Oral QAC breakfast   metoprolol tartrate  25 mg Oral BID   ondansetron (ZOFRAN) IV  4 mg Intravenous Once   sodium chloride flush  3 mL Intravenous Q12H   Continuous Infusions:  sodium chloride     PRN Meds: sodium chloride, acetaminophen, albuterol, azelastine, morphine injection, nitroGLYCERIN, ondansetron (ZOFRAN) IV, sodium chloride flush   Vital Signs    Vitals:   01/07/21 0400 01/07/21 0500 01/07/21 0600 01/07/21 0700  BP:  127/73 136/71 128/75  Pulse:  64 83 64  Resp:  '20 18 19  '$ Temp: 98.3 F (36.8 C)     TempSrc: Oral     SpO2:  95% 97% 95%  Weight:      Height:        Intake/Output Summary (Last 24 hours) at 01/07/2021 1504 Last data filed at 01/07/2021 1335 Gross per 24 hour  Intake 1080 ml  Output --  Net 1080 ml   Last 3 Weights 01/06/2021 01/06/2021 01/05/2021  Weight (lbs) 197 lb 14.4 oz 198 lb 198 lb  Weight (kg) 89.767 kg 89.812 kg 89.812 kg      Telemetry    SR, rare PVCs, HR 60s - Personally Reviewed  ECG    No new - Personally Reviewed  Physical Exam   GEN: No acute distress.   Neck: No JVD Cardiac: RRR, no murmurs, rubs, or gallops.  Respiratory: Clear to auscultation bilaterally. GI: Soft, nontender, non-distended  MS: No edema; No deformity. Neuro:  Nonfocal  Psych: Normal affect   Labs    High Sensitivity Troponin:   Recent Labs  Lab 01/05/21 0602 01/05/21 0826  01/06/21 1200 01/06/21 1651 01/07/21 1402  TROPONINIHS 33* 496* 4,626* 4,913* 3,783*      Chemistry Recent Labs  Lab 01/05/21 0602 01/06/21 0250  NA 136 139  K 3.9 3.6  CL 106 105  CO2 26 25  GLUCOSE 133* 97  BUN 14 14  CREATININE 0.95 0.88  CALCIUM 8.4* 8.7*  GFRNONAA >60 >60  ANIONGAP 4* 9     Hematology Recent Labs  Lab 01/05/21 0602 01/06/21 0250  WBC 7.6  7.8 9.2  RBC 5.46*  5.27* 4.59  HGB 15.5*  15.5* 13.6  HCT 48.7*  47.6* 41.6  MCV 89.2  90.3 90.6  MCH 28.4  29.4 29.6  MCHC 31.8  32.6 32.7  RDW 14.4  14.0 14.0  PLT 204  196 185    BNPNo results for input(s): BNP, PROBNP in the last 168 hours.   DDimer  Recent Labs  Lab 01/05/21 [phone]  DDIMER 0.41     Radiology    CARDIAC CATHETERIZATION  Result Date: 01/06/2021 Formatting of this result is different from the original.   Dist RCA lesion is 80% stenosed.   The left ventricular systolic function is normal.   LV end diastolic pressure is mildly elevated.  The left ventricular ejection fraction is greater than 65% by visual estimate.   There is no aortic valve stenosis. 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 RECOMMENDATIONS: Medical management of likely SCAD with at least 1 year of antiplatelet therapy.  If she has recurrent chest discomfort or exertional chest discomfort in the future, we can consider repeat angiography in 6 weeks though this may not be necessary. Ambulatory referral to cardiac rehab placed.   ECHOCARDIOGRAM COMPLETE  Result Date: 01/06/2021    ECHOCARDIOGRAM REPORT   Patient Name:   Lorraine Jimenez Date of Exam: 01/05/2021 Medical Rec #:  [id number]         Height:       62.0 in Accession #:    [id number]        Weight:       198.0 lb Date of Birth:  [birth year]          BSA:          1.904 m Patient Age:    60 years          BP:           103/77 mmHg Patient Gender:  F                 HR:           53 bpm. Exam Location:  [facility] Procedure: 2D Echo, Color Doppler, Cardiac Doppler and Intracardiac            Opacification Agent Indications:     R07.9 Chest Pain  History:         Patient has no prior history of Echocardiogram examinations.                  CKD; Risk Factors:Hypertension and Sleep Apnea. Asthma.  Sonographer:     Charmayne Sheer RDCS (AE) Referring Phys:  [id number] Kate Sable Diagnosing Phys: Kathlyn Sacramento MD  Sonographer Comments: Suboptimal apical window and no subcostal window. IMPRESSIONS  1. Left ventricular ejection fraction, by estimation, is 55 to 60%. The left ventricle has normal function. The left ventricle has no regional wall motion abnormalities. Left ventricular diastolic parameters were normal.  2. Right ventricular systolic function is normal. The right ventricular size is normal. Tricuspid regurgitation signal is inadequate for assessing PA pressure.  3. The mitral valve is normal in structure. Trivial mitral valve regurgitation. No evidence of mitral stenosis.  4. The aortic valve is normal in structure. Aortic valve regurgitation is not visualized. No aortic stenosis is present.  5. The inferior vena cava is normal in size with greater than 50% respiratory variability, suggesting right atrial pressure of 3 mmHg. FINDINGS  Left Ventricle: Left ventricular ejection fraction, by estimation, is 55 to 60%. The left ventricle has normal function. The left ventricle has no regional wall motion abnormalities. Definity contrast agent was given IV to delineate the left ventricular  endocardial borders. The left ventricular internal cavity size was normal in size. There is no left ventricular hypertrophy. Left ventricular diastolic parameters were normal. Right Ventricle: The right ventricular size is normal. No increase in right ventricular wall thickness. Right ventricular systolic function is normal. Tricuspid regurgitation signal is inadequate for  assessing PA pressure. Left Atrium: Left atrial size was normal in size. Right Atrium: Right atrial size was normal in size. Pericardium: There is no evidence of pericardial effusion. Mitral Valve: The mitral valve is  normal in structure. Trivial mitral valve regurgitation. No evidence of mitral valve stenosis. MV peak gradient, 2.2 mmHg. The mean mitral valve gradient is 1.0 mmHg. Tricuspid Valve: The tricuspid valve is normal in structure. Tricuspid valve regurgitation is trivial. No evidence of tricuspid stenosis. Aortic Valve: The aortic valve is normal in structure. Aortic valve regurgitation is not visualized. No aortic stenosis is present. Aortic valve mean gradient measures 5.0 mmHg. Aortic valve peak gradient measures 8.3 mmHg. Aortic valve area, by VTI measures 1.32 cm. Pulmonic Valve: The pulmonic valve was normal in structure. Pulmonic valve regurgitation is trivial. No evidence of pulmonic stenosis. Aorta: The aortic root is normal in size and structure. Venous: The inferior vena cava is normal in size with greater than 50% respiratory variability, suggesting right atrial pressure of 3 mmHg. IAS/Shunts: No atrial level shunt detected by color flow Doppler.  LEFT VENTRICLE PLAX 2D LVIDd:         3.50 cm  Diastology LVIDs:         2.20 cm  LV e' medial:    6.31 cm/s LV PW:         1.00 cm  LV E/e' medial:  9.2 LV IVS:        0.80 cm  LV e' lateral:   11.90 cm/s LVOT diam:     1.70 cm  LV E/e' lateral: 4.9 LV SV:         44 LV SV Index:   23 LVOT Area:     2.27 cm  LEFT ATRIUM           Index LA diam:      3.00 cm 1.58 cm/m LA Vol (A4C): 55.0 ml 28.89 ml/m  AORTIC VALVE                   PULMONIC VALVE AV Area (Vmax):    1.62 cm    PV Vmax:       1.23 m/s AV Area (Vmean):   1.61 cm    PV Vmean:      89.400 cm/s AV Area (VTI):     1.32 cm    PV VTI:        0.314 m AV Vmax:           144.00 cm/s PV Peak grad:  6.1 mmHg AV Vmean:          99.900 cm/s PV Mean grad:  4.0 mmHg AV VTI:            0.330 m  AV Peak Grad:      8.3 mmHg AV Mean Grad:      5.0 mmHg LVOT Vmax:         103.00 cm/s LVOT Vmean:        70.900 cm/s LVOT VTI:          0.192 m LVOT/AV VTI ratio: 0.58  AORTA Ao Root diam: 2.90 cm MITRAL VALVE MV Area (PHT): 4.46 cm    SHUNTS MV Area VTI:   1.70 cm    Systemic VTI:  0.19 m MV Peak grad:  2.2 mmHg    Systemic Diam: 1.70 cm MV Mean grad:  1.0 mmHg MV Vmax:       0.75 m/s MV Vmean:      37.9 cm/s MV Decel Time: 170 msec MV E velocity: 57.80 cm/s MV A velocity: 64.30 cm/s MV E/A ratio:  0.90 Kathlyn Sacramento MD Electronically signed by Kathlyn Sacramento MD Signature Date/Time: 01/06/2021/8:20:51 AM  Final     Cardiac Studies   Cardiac cath   Dist RCA lesion is 80% stenosed.   The left ventricular systolic function is normal.   LV end diastolic pressure is mildly elevated.   The left ventricular ejection fraction is greater than 65% by visual estimate.   There is no aortic valve stenosis.   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   RECOMMENDATIONS: Medical management of likely SCAD with at least 1 year of antiplatelet therapy.  If she has recurrent chest discomfort or exertional chest discomfort in the future, we can consider repeat angiography in 6 weeks though this may not be necessary. Ambulatory referral to cardiac rehab placed.   Echo 01/05/21 1. Left ventricular ejection fraction, by estimation, is 55 to 60%. The  left ventricle has normal function. The left ventricle has no regional  wall motion abnormalities. Left ventricular diastolic parameters were  normal.   2. Right ventricular systolic function is normal. The right ventricular  size is normal. Tricuspid regurgitation signal is inadequate for assessing  PA pressure.   3. The mitral valve is normal in structure. Trivial mitral valve  regurgitation. No evidence of mitral stenosis.   4. The aortic valve is  normal in structure. Aortic valve regurgitation is  not visualized. No aortic stenosis is present.   5. The inferior vena cava is normal in size with greater than 50%  respiratory variability, suggesting right atrial pressure of 3 mmHg.  Patient Profile     60 y.o. female with h/o HTN, esophageal cancer s/p postsurgery, hypothyroidism, sleep apnea who is being seen for NSTEMI.   Assessment & Plan    NSTEMI SCAD - Cardiac cath showed 80% RCA with suspected SCAD. Mildly elevated LVEDP.  - Recommendations for antiplatelet therapy for 1 year. If she has refractory angina can repeat angiography - echo showed LVEF 50-60%, no WMA, trivial MR - continue ASA '81mg'$  daily, Lopressor '25mg'$  daily, Imdur '30mg'$  daily, lipitor - right radial cath site stable - Will check labs - Refer to cardiac rehab - Given episode of chest pain last night, plan to watch overnight   NSVT - started on BB as above - no further runs on tele   HTN - started on lopressor '25mg'$  daily, Imdur '30mg'$  daily - BP reasonable   HLD - LDL 99 - started on atorvastatin '40mg'$  daily  For questions or updates, please contact [facility] Please consult [email or web address] for contact info under        Signed, Tuleen Mandelbaum Ninfa Meeker, PA-C  01/07/2021, 3:04 PM

## 2021-01-08 ENCOUNTER — Inpatient Hospital Stay: Payer: BC Managed Care – PPO

## 2021-01-08 ENCOUNTER — Telehealth: Payer: Self-pay | Admitting: Medical

## 2021-01-08 DIAGNOSIS — I10 Essential (primary) hypertension: Secondary | ICD-10-CM | POA: Diagnosis not present

## 2021-01-08 DIAGNOSIS — I214 Non-ST elevation (NSTEMI) myocardial infarction: Secondary | ICD-10-CM | POA: Diagnosis not present

## 2021-01-08 LAB — TROPONIN I (HIGH SENSITIVITY): Troponin I (High Sensitivity): 4142 ng/L (ref <18)

## 2021-01-08 MED ORDER — ASPIRIN 81 MG PO TBEC: 81.0000 mg | DELAYED_RELEASE_TABLET | Freq: Every day | ORAL | 0 refills | Status: DC

## 2021-01-08 MED ORDER — ATORVASTATIN CALCIUM 40 MG PO TABS: 40.0000 mg | ORAL_TABLET | Freq: Every day | ORAL | 0 refills | Status: DC

## 2021-01-08 MED ORDER — ISOSORBIDE MONONITRATE ER 60 MG PO TB24
60.0000 mg | ORAL_TABLET | Freq: Every day | ORAL | Status: DC
  Administered 2021-01-08: 60 mg via ORAL
  Filled 2021-01-08: qty 1

## 2021-01-08 MED ORDER — ISOSORBIDE MONONITRATE ER 60 MG PO TB24: 60.0000 mg | ORAL_TABLET | Freq: Every day | ORAL | 0 refills | Status: DC

## 2021-01-08 MED ORDER — METOPROLOL SUCCINATE ER 25 MG PO TB24
25.0000 mg | ORAL_TABLET | Freq: Every day | ORAL | Status: DC
  Administered 2021-01-08: 25 mg via ORAL
  Filled 2021-01-08: qty 1

## 2021-01-08 MED ORDER — NITROGLYCERIN 0.4 MG SL SUBL: 0.4000 mg | SUBLINGUAL_TABLET | SUBLINGUAL | 0 refills | Status: AC | PRN

## 2021-01-08 MED ORDER — METOPROLOL SUCCINATE ER 25 MG PO TB24: 25.0000 mg | ORAL_TABLET | Freq: Every day | ORAL | 0 refills | Status: DC

## 2021-01-08 NOTE — Telephone Encounter (Signed)
Furth, Cadence H, PA-C  P Cv Div Burl Triage Pt needs hospital f/u, TOC, in 1-2 weeks. New pt for Agbor-etang. she had cath found to have TOC. Cardiac rehab referral. Please call and shedule

## 2021-01-08 NOTE — Progress Notes (Signed)
Progress Note  Patient Name: Lorraine Jimenez Date of Encounter: 01/08/2021  [facility] Cardiologist: New- Dr. Garen Lah  Subjective   No further chest pain overnight. Cath site stable.   Inpatient Medications    Scheduled Meds:  aspirin EC  81 mg Oral Daily   atorvastatin  40 mg Oral Daily   heparin  5,000 Units Subcutaneous Q8H   isosorbide mononitrate  60 mg Oral Daily   levothyroxine  50 mcg Oral QAC breakfast   metoprolol succinate  25 mg Oral Daily   ondansetron (ZOFRAN) IV  4 mg Intravenous Once   sodium chloride flush  3 mL Intravenous Q12H   Continuous Infusions:  sodium chloride     PRN Meds: sodium chloride, acetaminophen, albuterol, azelastine, morphine injection, nitroGLYCERIN, ondansetron (ZOFRAN) IV, sodium chloride flush   Vital Signs    Vitals:   01/07/21 1621 01/07/21 2022 01/08/21 0443 01/08/21 1105  BP: 121/67 111/64 100/61 107/61  Pulse: (!) 58 66 62 62  Resp: '19 19 20 18  '$ Temp: 98.6 F (37 C) 98.8 F (37.1 C) 97.9 F (36.6 C) 97.7 F (36.5 C)  TempSrc:    Oral  SpO2:  98% 99% 98%  Weight:      Height:        Intake/Output Summary (Last 24 hours) at 01/08/2021 1148 Last data filed at 01/07/2021 2100 Gross per 24 hour  Intake 720 ml  Output --  Net 720 ml   Last 3 Weights 01/06/2021 01/06/2021 01/05/2021  Weight (lbs) 197 lb 14.4 oz 198 lb 198 lb  Weight (kg) 89.767 kg 89.812 kg 89.812 kg      Telemetry    NSR, PVCs, HR 60s - Personally Reviewed  ECG    No new - Personally Reviewed  Physical Exam   GEN: No acute distress.   Neck: No JVD Cardiac: RRR, no murmurs, rubs, or gallops.  Respiratory: Clear to auscultation bilaterally. GI: Soft, nontender, non-distended  MS: No edema; No deformity. Neuro:  Nonfocal  Psych: Normal affect   Labs    High Sensitivity Troponin:   Recent Labs  Lab 01/06/21 1200 01/06/21 1651 01/07/21 1402 01/07/21 1545 01/08/21 0617  TROPONINIHS 4,626* 4,913* 3,783* 4,227* 4,142*       Chemistry Recent Labs  Lab 01/05/21 0602 01/06/21 0250 01/07/21 1545  NA 136 139 137  K 3.9 3.6 4.5  CL 106 105 101  CO2 '26 25 28  '$ GLUCOSE 133* 97 105*  BUN '14 14 11  '$ CREATININE 0.95 0.88 0.93  CALCIUM 8.4* 8.7* 9.0  GFRNONAA >60 >60 >60  ANIONGAP 4* 9 8     Hematology Recent Labs  Lab 01/05/21 0602 01/06/21 0250 01/07/21 1545  WBC 7.6  7.8 9.2 10.6*  RBC 5.46*  5.27* 4.59 4.81  HGB 15.5*  15.5* 13.6 14.4  HCT 48.7*  47.6* 41.6 43.2  MCV 89.2  90.3 90.6 89.8  MCH 28.4  29.4 29.6 29.9  MCHC 31.8  32.6 32.7 33.3  RDW 14.4  14.0 14.0 13.6  PLT 204  196 185 193    BNPNo results for input(s): BNP, PROBNP in the last 168 hours.   DDimer  Recent Labs  Lab 01/05/21 [phone]  DDIMER 0.41     Radiology    CARDIAC CATHETERIZATION  Result Date: 01/06/2021 Formatting of this result is different from the original.   Dist RCA lesion is 80% stenosed.   The left ventricular systolic function is normal.   LV end diastolic pressure is mildly  elevated.   The left ventricular ejection fraction is greater than 65% by visual estimate.   There is no aortic valve stenosis. 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 RECOMMENDATIONS: Medical management of likely SCAD with at least 1 year of antiplatelet therapy.  If she has recurrent chest discomfort or exertional chest discomfort in the future, we can consider repeat angiography in 6 weeks though this may not be necessary. Ambulatory referral to cardiac rehab placed.    Cardiac Studies   Cardiac cath   Dist RCA lesion is 80% stenosed.   The left ventricular systolic function is normal.   LV end diastolic pressure is mildly elevated.   The left ventricular ejection fraction is greater than 65% by visual estimate.   There is no aortic valve stenosis.   Single-vessel coronary artery disease including 80% distal RCA  stenosis which appears to be consistent with a spontaneous coronary artery dissection (SCAD).   RECOMMENDATIONS: Medical management of likely SCAD with at least 1 year of antiplatelet therapy.  If she has recurrent chest discomfort or exertional chest discomfort in the future, we can consider repeat angiography in 6 weeks though this may not be necessary. Ambulatory referral to cardiac rehab placed.     Echo 01/05/21 1. Left ventricular ejection fraction, by estimation, is 55 to 60%. The  left ventricle has normal function. The left ventricle has no regional  wall motion abnormalities. Left ventricular diastolic parameters were  normal.   2. Right ventricular systolic function is normal. The right ventricular  size is normal. Tricuspid regurgitation signal is inadequate for assessing  PA pressure.   3. The mitral valve is normal in structure. Trivial mitral valve  regurgitation. No evidence of mitral stenosis.   4. The aortic valve is normal in structure. Aortic valve regurgitation is  not visualized. No aortic stenosis is present.   5. The inferior vena cava is normal in size with greater than 50%  respiratory variability, suggesting right atrial pressure of 3 mmHg.  Patient Profile     60 y.o. female with h/o HTN, esophageal cancer s/p postsurgery, hypothyroidism, sleep apnea who is being seen for NSTEMI.   Assessment & Plan    NSTEMI SCAD - Cardiac cath showed 80% RCA with suspected SCAD. Mildly elevated LVEDP.  - Recommendations for antiplatelet therapy for 1 year. If she has refractory angina can repeat angiography - echo showed LVEF 50-60%, no WMA, trivial MR - continue ASA '81mg'$  daily, Lopressor '25mg'$  daily, and lipitor - Imdur increased to '60mg'$  daily - right radial cath site stable - HS trop trending down - Refer to cardiac rehab - No further chest pain, can likely discharge   NSVT - started on BB as above - no further runs on tele   HTN - continue lopressor '25mg'$   daily, Imdur '60mg'$  daily - Bps borderline   HLD - LDL 99 - started on atorvastatin '40mg'$  daily  For questions or updates, please contact [facility] Please consult [email or web address] for contact info under        Signed, Guage Efferson Ninfa Meeker, PA-C  01/08/2021, 11:48 AM

## 2021-01-08 NOTE — Discharge Summary (Signed)
Physician Discharge Summary  Lorraine Jimenez [id number] DOB: 06-05-1960 DOA: 01/05/2021  PCP: Earlie Counts, FNP  Admit date: 01/05/2021 Discharge date: 01/08/2021  Admitted From: Home Discharge disposition: Home   Code Status: Full Code  Diet Recommendation:  Discharge Diagnosis:   Principal Problem:   NSTEMI (non-ST elevated myocardial infarction) ([place]) Active Problems:   Primary hypertension   GERD (gastroesophageal reflux disease)   Hypothyroidism   Sleep apnea   Obesity (BMI 30-39.9)   History of Present Illness / Brief narrative:  Lorraine Jimenez is a 61 y.o. female with medical history significant for hypertension, esophageal cancer status postsurgery, hypothyroidism and sleep apnea who presented to the ED from home for evaluation of left-sided chest pain.   Is concerned because he woke up in the early hours of the morning with chest pain in the left anterior wall 8/10 in intensity radiating to left arm and associated with nausea, shortness of breath and diaphoresis.  Troponin was noted to be elevated Noncontrast chest CT was unremarkable. Patient was admitted to hospitalist service. Cardiology was consulted.   8/8, patient underwent cardiac cath without intervention.    Subjective:  Seen and examined this morning.  Pleasant middle-aged Caucasian female.  Propped up in bed.  Not in distress.  No chest pain last 24 hours.  Husband at bedside.  Hospital Course:  Spontaneous coronary artery dissection Coronary artery disease -Presented with sudden onset chest pain. -Troponin was elevated over 3000 because of which patient underwent left heart cath on 8/8.  Found to have single-vessel coronary artery disease of distal RCA at 80% which appears to be consistent with a spontaneous coronary artery dissection. -She had recurrence of chest pain because of which she was admitted on nitro drip for 48 hours -No recurrence of chest pain.  Troponin still elevated to over 4000  this morning but gradually trending down.   -Okay to discharge home per cardiology. -Continue aspirin, statin.  No Plavix. -Continue Toprol, Imdur. -Ultrasound duplex of renal arteries were obtained which ruled out fibromuscular dysplasia. Recent Labs    01/07/21 1402 01/07/21 1545 01/08/21 0617  TROPONINIHS 3,783* 4,227* 4,142*   Hypertension -Stable blood pressure on metoprolol and Imdur. -I would not continue amlodipine and losartan at least  Morbid obesity  -Body mass index is 36.2 kg/m. Patient has been advised to make an attempt to improve diet and exercise patterns to aid in weight loss.  Obstructive sleep apnea  -Recommend outpatient sleep study.    Hypothyroidism -Continue Synthroid  Abnormal chest x-ray Changes from esophageal surgery/gastric pull-through and condition of abdominal fat and bowel loops into the posterior thoracic cavity accounting for abnormalities identified on chest x-ray     Allergies as of 01/08/2021   No Known Allergies      Medication List     STOP taking these medications    amLODipine 10 MG tablet Commonly known as: NORVASC   budesonide-formoterol 160-4.5 MCG/ACT inhaler Commonly known as: SYMBICORT   losartan 100 MG tablet Commonly known as: COZAAR   methocarbamol 500 MG tablet Commonly known as: ROBAXIN   montelukast 10 MG tablet Commonly known as: SINGULAIR       TAKE these medications    albuterol 108 (90 Base) MCG/ACT inhaler Commonly known as: VENTOLIN HFA Inhale 2 puffs into the lungs every 6 (six) hours as needed for wheezing or shortness of breath. What changed: Another medication with the same name was removed. Continue taking this medication, and follow the directions you see  here.   aspirin 81 MG EC tablet Take 1 tablet (81 mg total) by mouth daily. Swallow whole. Start taking on: January 09, 2021   atorvastatin 40 MG tablet Commonly known as: LIPITOR Take 1 tablet (40 mg total) by mouth daily. Start  taking on: January 09, 2021   azelastine 0.1 % nasal spray Commonly known as: ASTELIN Place 2 sprays into both nostrils 2 (two) times daily. Use in each nostril as directed   isosorbide mononitrate 60 MG 24 hr tablet Commonly known as: IMDUR Take 1 tablet (60 mg total) by mouth daily. Start taking on: January 09, 2021   levothyroxine 50 MCG tablet Commonly known as: SYNTHROID Take 50 mcg by mouth daily before breakfast.   metoprolol succinate 25 MG 24 hr tablet Commonly known as: TOPROL-XL Take 1 tablet (25 mg total) by mouth daily. Start taking on: January 09, 2021   nitroGLYCERIN 0.4 MG SL tablet Commonly known as: NITROSTAT Place 1 tablet (0.4 mg total) under the tongue every 5 (five) minutes x 3 doses as needed for up to 10 doses for chest pain.        Discharge Instructions:  Follow with Primary MD D, Ples Specter, FNP in 7 days   Get CBC/BMP checked in next visit within 1 week by PCP or SNF MD ( we routinely change or add medications that can affect your baseline labs and fluid status, therefore we recommend that you get the mentioned basic workup next visit with your PCP, your PCP may decide not to get them or add new tests based on their clinical decision)  On your next visit with your PCP, please Get Medicines reviewed and adjusted.  Please request your PCP  to go over all Hospital Tests and Procedure/Radiological results at the follow up, please get all Hospital records sent to your Prim MD by signing hospital release before you go home.  Activity: As tolerated with Full fall precautions use walker/cane & assistance as needed  For Heart failure patients - Check your Weight same time everyday, if you gain over 2 pounds, or you develop in leg swelling, experience more shortness of breath or chest pain, call your Primary MD immediately. Follow Cardiac Low Salt Diet and 1.5 lit/day fluid restriction.  If you have smoked or chewed Tobacco in the last 2 yrs please stop  smoking, stop any regular Alcohol  and or any Recreational drug use.  If you experience worsening of your admission symptoms, develop shortness of breath, life threatening emergency, suicidal or homicidal thoughts you must seek medical attention immediately by calling 911 or calling your MD immediately  if symptoms less severe.  You Must read complete instructions/literature along with all the possible adverse reactions/side effects for all the Medicines you take and that have been prescribed to you. Take any new Medicines after you have completely understood and accpet all the possible adverse reactions/side effects.   Do not drive, operate heavy machinery, perform activities at heights, swimming or participation in water activities or provide baby sitting services if your were admitted for syncope or siezures until you have seen by Primary MD or a Neurologist and advised to do so again.  Do not drive when taking Pain medications.  Do not take more than prescribed Pain, Sleep and Anxiety Medications  Wear Seat belts while driving.   Please note You were cared for by a hospitalist during your hospital stay. If you have any questions about your discharge medications or the care you received while  you were in the hospital after you are discharged, you can call the unit and asked to speak with the hospitalist on call if the hospitalist that took care of you is not available. Once you are discharged, your primary care physician will handle any further medical issues. Please note that NO REFILLS for any discharge medications will be authorized once you are discharged, as it is imperative that you return to your primary care physician (or establish a relationship with a primary care physician if you do not have one) for your aftercare needs so that they can reassess your need for medications and monitor your lab values.    Follow ups:   Discharge Instructions     AMB Referral to Cardiac  Rehabilitation - Phase II   Complete by: As directed    Diagnosis: NSTEMI   After initial evaluation and assessments completed: Virtual Based Care may be provided alone or in conjunction with Phase 2 Cardiac Rehab based on patient barriers.: Yes   Diet - low sodium heart healthy   Complete by: As directed    Increase activity slowly   Complete by: As directed        Follow-up Information     D, Ples Specter, FNP Follow up.   Specialty: Family Medicine Contact information: [place] [address] [phone]                 Wound care:     Discharge Exam:   Vitals:   01/07/21 1621 01/07/21 2022 01/08/21 0443 01/08/21 1105  BP: 121/67 111/64 100/61 107/61  Pulse: (!) 58 66 62 62  Resp: '19 19 20 18  '$ Temp: 98.6 F (37 C) 98.8 F (37.1 C) 97.9 F (36.6 C) 97.7 F (36.5 C)  TempSrc:    Oral  SpO2:  98% 99% 98%  Weight:      Height:        Body mass index is 36.2 kg/m.  General exam: Pleasant, middle-aged Caucasian female.  Not in distress Skin: No rashes, lesions or ulcers. HEENT: Atraumatic, normocephalic, no obvious bleeding Lungs: Clear to auscultation bilaterally CVS: Regular rate and rhythm, no murmur GI/Abd soft, nontender, nondistended, bowel sound present CNS: Alert, awake, oriented x3 Psychiatry: Mood appropriate Extremities: No pedal edema, no calf tenderness  Time coordinating discharge: 35 minutes   The results of significant diagnostics from this hospitalization (including imaging, microbiology, ancillary and laboratory) are listed below for reference.    Procedures and Diagnostic Studies:   DG Chest 2 View  Result Date: 01/05/2021 CLINICAL DATA:  60 year old female with history of chest pain and shortness of breath. EXAM: CHEST - 2 VIEW COMPARISON:  No priors. FINDINGS: Extensive airspace consolidation in the left lower lobe. Less dense consolidation also noted in the right lower lobe. Small left parapneumonic pleural  effusion. No definite right pleural effusion. No pneumothorax. No evidence of pulmonary edema. Heart size is normal. Upper mediastinal contours are within normal limits. IMPRESSION: 1. Left lower lobe pneumonia with small left parapneumonic pleural effusion. 2. Probable developing airspace consolidation also noted in the right lower lobe. Electronically Signed   By: Vinnie Langton M.D.   On: 01/05/2021 06:36   CT CHEST WO CONTRAST  Result Date: 01/05/2021 CLINICAL DATA:  60 year old female with acute chest pain and shortness of breath. History of distal esophageal resection with gastric pull-through. EXAM: CT CHEST WITHOUT CONTRAST TECHNIQUE: Multidetector CT imaging of the chest was performed following the standard protocol without IV contrast. COMPARISON:  Chest radiograph performed today and 11/05/2011 chest CT FINDINGS: Cardiovascular: UPPER limits normal heart size again noted. There is no evidence of thoracic aortic aneurysm or pericardial effusion. Mediastinum/Nodes: Changes from esophageal surgery and gastric pull-through identified and not significantly changed from 2013. No mediastinal mass or enlarged lymph nodes are identified. Prominent LEFT thyroid is again identified. Lungs/Pleura: Abnormalities on today's chest radiograph represent changes from esophageal surgery/gastric pull-through and herniation of abdominal fat and bowel loops into the posterior thoracic cavity. Mild dependent and basilar atelectasis/scarring again noted. There is no evidence of airspace disease, consolidation, pulmonary mass, pleural effusion or pneumothorax. Is noted. Upper Abdomen: No acute abnormality. Musculoskeletal: No acute or suspicious bony abnormalities are noted. IMPRESSION: 1. No evidence of acute abnormality. 2. Changes from esophageal surgery/gastric pull-through and herniation of abdominal fat and bowel loops into the posterior thoracic cavity accounting for the abnormalities identified on chest radiograph  today. 3. Mild dependent and basilar atelectasis/scarring. Electronically Signed   By: Margarette Canada M.D.   On: 01/05/2021 10:30   CARDIAC CATHETERIZATION  Result Date: 01/06/2021 Formatting of this result is different from the original.   Dist RCA lesion is 80% stenosed.   The left ventricular systolic function is normal.   LV end diastolic pressure is mildly elevated.   The left ventricular ejection fraction is greater than 65% by visual estimate.   There is no aortic valve stenosis. 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 RECOMMENDATIONS: Medical management of likely SCAD with at least 1 year of antiplatelet therapy.  If she has recurrent chest discomfort or exertional chest discomfort in the future, we can consider repeat angiography in 6 weeks though this may not be necessary. Ambulatory referral to cardiac rehab placed.   ECHOCARDIOGRAM COMPLETE  Result Date: 01/06/2021    ECHOCARDIOGRAM REPORT   Patient Name:   Lorraine Jimenez Date of Exam: 01/05/2021 Medical Rec #:  [id number]         Height:       62.0 in Accession #:    [id number]        Weight:       198.0 lb Date of Birth:  Nov 28, 1960          BSA:          1.904 m Patient Age:    48 years          BP:           103/77 mmHg Patient Gender: F                 HR:           53 bpm. Exam Location:  [facility] Procedure: 2D Echo, Color Doppler, Cardiac Doppler and Intracardiac            Opacification Agent Indications:     R07.9 Chest Pain  History:         Patient has no prior history of Echocardiogram examinations.                  CKD; Risk Factors:Hypertension and Sleep Apnea. Asthma.  Sonographer:     Charmayne Sheer RDCS (AE) Referring Phys:  [id number] Kate Sable Diagnosing Phys: Kathlyn Sacramento MD  Sonographer Comments: Suboptimal apical window and no subcostal window. IMPRESSIONS  1. Left ventricular ejection fraction, by estimation, is 55  to 60%. The left ventricle has normal function. The left ventricle has no regional  wall motion abnormalities. Left ventricular diastolic parameters were normal.  2. Right ventricular systolic function is normal. The right ventricular size is normal. Tricuspid regurgitation signal is inadequate for assessing PA pressure.  3. The mitral valve is normal in structure. Trivial mitral valve regurgitation. No evidence of mitral stenosis.  4. The aortic valve is normal in structure. Aortic valve regurgitation is not visualized. No aortic stenosis is present.  5. The inferior vena cava is normal in size with greater than 50% respiratory variability, suggesting right atrial pressure of 3 mmHg. FINDINGS  Left Ventricle: Left ventricular ejection fraction, by estimation, is 55 to 60%. The left ventricle has normal function. The left ventricle has no regional wall motion abnormalities. Definity contrast agent was given IV to delineate the left ventricular  endocardial borders. The left ventricular internal cavity size was normal in size. There is no left ventricular hypertrophy. Left ventricular diastolic parameters were normal. Right Ventricle: The right ventricular size is normal. No increase in right ventricular wall thickness. Right ventricular systolic function is normal. Tricuspid regurgitation signal is inadequate for assessing PA pressure. Left Atrium: Left atrial size was normal in size. Right Atrium: Right atrial size was normal in size. Pericardium: There is no evidence of pericardial effusion. Mitral Valve: The mitral valve is normal in structure. Trivial mitral valve regurgitation. No evidence of mitral valve stenosis. MV peak gradient, 2.2 mmHg. The mean mitral valve gradient is 1.0 mmHg. Tricuspid Valve: The tricuspid valve is normal in structure. Tricuspid valve regurgitation is trivial. No evidence of tricuspid stenosis. Aortic Valve: The aortic valve is normal in structure. Aortic valve regurgitation is not  visualized. No aortic stenosis is present. Aortic valve mean gradient measures 5.0 mmHg. Aortic valve peak gradient measures 8.3 mmHg. Aortic valve area, by VTI measures 1.32 cm. Pulmonic Valve: The pulmonic valve was normal in structure. Pulmonic valve regurgitation is trivial. No evidence of pulmonic stenosis. Aorta: The aortic root is normal in size and structure. Venous: The inferior vena cava is normal in size with greater than 50% respiratory variability, suggesting right atrial pressure of 3 mmHg. IAS/Shunts: No atrial level shunt detected by color flow Doppler.  LEFT VENTRICLE PLAX 2D LVIDd:         3.50 cm  Diastology LVIDs:         2.20 cm  LV e' medial:    6.31 cm/s LV PW:         1.00 cm  LV E/e' medial:  9.2 LV IVS:        0.80 cm  LV e' lateral:   11.90 cm/s LVOT diam:     1.70 cm  LV E/e' lateral: 4.9 LV SV:         44 LV SV Index:   23 LVOT Area:     2.27 cm  LEFT ATRIUM           Index LA diam:      3.00 cm 1.58 cm/m LA Vol (A4C): 55.0 ml 28.89 ml/m  AORTIC VALVE                   PULMONIC VALVE AV Area (Vmax):    1.62 cm    PV Vmax:       1.23 m/s AV Area (Vmean):   1.61 cm    PV Vmean:      89.400 cm/s AV Area (VTI):     1.32 cm    PV VTI:        0.314 m  AV Vmax:           144.00 cm/s PV Peak grad:  6.1 mmHg AV Vmean:          99.900 cm/s PV Mean grad:  4.0 mmHg AV VTI:            0.330 m AV Peak Grad:      8.3 mmHg AV Mean Grad:      5.0 mmHg LVOT Vmax:         103.00 cm/s LVOT Vmean:        70.900 cm/s LVOT VTI:          0.192 m LVOT/AV VTI ratio: 0.58  AORTA Ao Root diam: 2.90 cm MITRAL VALVE MV Area (PHT): 4.46 cm    SHUNTS MV Area VTI:   1.70 cm    Systemic VTI:  0.19 m MV Peak grad:  2.2 mmHg    Systemic Diam: 1.70 cm MV Mean grad:  1.0 mmHg MV Vmax:       0.75 m/s MV Vmean:      37.9 cm/s MV Decel Time: 170 msec MV E velocity: 57.80 cm/s MV A velocity: 64.30 cm/s MV E/A ratio:  0.90 Kathlyn Sacramento MD Electronically signed by Kathlyn Sacramento MD Signature Date/Time: 01/06/2021/8:20:51  AM    Final      Labs:   Basic Metabolic Panel: Recent Labs  Lab 01/05/21 0602 01/05/21 1500 01/06/21 0250 01/07/21 1545  NA 136  --  139 137  K 3.9  --  3.6 4.5  CL 106  --  105 101  CO2 26  --  25 28  GLUCOSE 133*  --  97 105*  BUN 14  --  14 11  CREATININE 0.95  --  0.88 0.93  CALCIUM 8.4*  --  8.7* 9.0  MG  --  2.2  --   --    GFR Estimated Creatinine Clearance: 67.9 mL/min (by C-G formula based on SCr of 0.93 mg/dL). Liver Function Tests: No results for input(s): AST, ALT, ALKPHOS, BILITOT, PROT, ALBUMIN in the last 168 hours. No results for input(s): LIPASE, AMYLASE in the last 168 hours. No results for input(s): AMMONIA in the last 168 hours. Coagulation profile Recent Labs  Lab 01/05/21 0826  INR 0.9    CBC: Recent Labs  Lab 01/05/21 0602 01/06/21 0250 01/07/21 1545  WBC 7.6  7.8 9.2 10.6*  NEUTROABS 5.7  --   --   HGB 15.5*  15.5* 13.6 14.4  HCT 48.7*  47.6* 41.6 43.2  MCV 89.2  90.3 90.6 89.8  PLT 204  196 185 193   Cardiac Enzymes: No results for input(s): CKTOTAL, CKMB, CKMBINDEX, TROPONINI in the last 168 hours. BNP: Invalid input(s): POCBNP CBG: No results for input(s): GLUCAP in the last 168 hours. D-Dimer No results for input(s): DDIMER in the last 72 hours. Hgb A1c No results for input(s): HGBA1C in the last 72 hours. Lipid Profile Recent Labs    01/06/21 0250  CHOL 161  HDL 44  LDLCALC 99  TRIG 91  CHOLHDL 3.7   Thyroid function studies No results for input(s): TSH, T4TOTAL, T3FREE, THYROIDAB in the last 72 hours.  Invalid input(s): FREET3 Anemia work up No results for input(s): VITAMINB12, FOLATE, FERRITIN, TIBC, IRON, RETICCTPCT in the last 72 hours. Microbiology Recent Results (from the past 240 hour(s))  Resp Panel by RT-PCR (Flu A&B, Covid) Nasopharyngeal Swab     Status: None   Collection Time: 01/05/21  8:26 AM  Specimen: Nasopharyngeal Swab; Nasopharyngeal(NP) swabs in vial transport medium  Result Value  Ref Range Status   SARS Coronavirus 2 by RT PCR NEGATIVE NEGATIVE Final    Comment: (NOTE) SARS-CoV-2 target nucleic acids are NOT DETECTED.  The SARS-CoV-2 RNA is generally detectable in upper respiratory specimens during the acute phase of infection. The lowest concentration of SARS-CoV-2 viral copies this assay can detect is 138 copies/mL. A negative result does not preclude SARS-Cov-2 infection and should not be used as the sole basis for treatment or other patient management decisions. A negative result may occur with  improper specimen collection/handling, submission of specimen other than nasopharyngeal swab, presence of viral mutation(s) within the areas targeted by this assay, and inadequate number of viral copies(<138 copies/mL). A negative result must be combined with clinical observations, patient history, and epidemiological information. The expected result is Negative.  Fact Sheet for Patients:  EntrepreneurPulse.com.au  Fact Sheet for Healthcare Providers:  IncredibleEmployment.be  This test is no t yet approved or cleared by the [place] FDA and  has been authorized for detection and/or diagnosis of SARS-CoV-2 by FDA under an Emergency Use Authorization (EUA). This EUA will remain  in effect (meaning this test can be used) for the duration of the COVID-19 declaration under Section 564(b)(1) of the Act, 21 U.S.C.section 360bbb-3(b)(1), unless the authorization is terminated  or revoked sooner.       Influenza A by PCR NEGATIVE NEGATIVE Final   Influenza B by PCR NEGATIVE NEGATIVE Final    Comment: (NOTE) The Xpert Xpress SARS-CoV-2/FLU/RSV plus assay is intended as an aid in the diagnosis of influenza from Nasopharyngeal swab specimens and should not be used as a sole basis for treatment. Nasal washings and aspirates are unacceptable for Xpert Xpress SARS-CoV-2/FLU/RSV testing.  Fact Sheet for  Patients: EntrepreneurPulse.com.au  Fact Sheet for Healthcare Providers: IncredibleEmployment.be  This test is not yet approved or cleared by the [place] FDA and has been authorized for detection and/or diagnosis of SARS-CoV-2 by FDA under an Emergency Use Authorization (EUA). This EUA will remain in effect (meaning this test can be used) for the duration of the COVID-19 declaration under Section 564(b)(1) of the Act, 21 U.S.C. section 360bbb-3(b)(1), unless the authorization is terminated or revoked.  Performed at [facility], [address] [place], [place] [address]      Signed: Terrilee Croak  [facility] 01/08/2021, 3:40 PM

## 2021-01-08 NOTE — Telephone Encounter (Signed)
TOC- currently admitted. Fwd to scheduling to schedule.

## 2021-01-09 NOTE — Telephone Encounter (Signed)
Scheduled 08/29 with Dr Garen Lah

## 2021-01-09 NOTE — Telephone Encounter (Signed)
Patient contacted regarding discharge from [facility] on 01/08/21.  Patient understands to follow up with provider Dr. Garen Lah on 01/27/21 at 3:40 pm at the [facility] office. Patient understands discharge instructions? yes Patient understands medications and regiment? yes Patient understands to bring all medications to this visit? yes  Patient made aware of the office address and location.

## 2021-01-13 ENCOUNTER — Other Ambulatory Visit: Payer: Self-pay | Admitting: *Deleted

## 2021-01-13 DIAGNOSIS — I214 Non-ST elevation (NSTEMI) myocardial infarction: Secondary | ICD-10-CM

## 2021-01-17 ENCOUNTER — Ambulatory Visit
Admission: RE | Admit: 2021-01-17 | Discharge: 2021-01-17 | Disposition: A | Discharge status: Home / Self Care | Payer: BC Managed Care – PPO | Source: Ambulatory Visit

## 2021-01-17 ENCOUNTER — Other Ambulatory Visit: Payer: Self-pay

## 2021-01-17 DIAGNOSIS — Z1231 Encounter for screening mammogram for malignant neoplasm of breast: Secondary | ICD-10-CM | POA: Insufficient documentation

## 2021-01-27 ENCOUNTER — Ambulatory Visit (INDEPENDENT_AMBULATORY_CARE_PROVIDER_SITE_OTHER): Payer: BC Managed Care – PPO | Admitting: Cardiology

## 2021-01-27 ENCOUNTER — Other Ambulatory Visit: Payer: Self-pay

## 2021-01-27 ENCOUNTER — Encounter: Payer: Self-pay | Admitting: Cardiology

## 2021-01-27 VITALS — BP 140/78 | HR 59 | Ht 62.0 in | Wt 194.4 lb

## 2021-01-27 DIAGNOSIS — I2510 Atherosclerotic heart disease of native coronary artery without angina pectoris: Secondary | ICD-10-CM

## 2021-01-27 DIAGNOSIS — I10 Essential (primary) hypertension: Secondary | ICD-10-CM | POA: Diagnosis not present

## 2021-01-27 DIAGNOSIS — G4733 Obstructive sleep apnea (adult) (pediatric): Secondary | ICD-10-CM

## 2021-01-27 NOTE — Patient Instructions (Signed)
Medication Instructions:   Your physician recommends that you continue on your current medications as directed. Please refer to the Current Medication list given to you today.  *If you need a refill on your cardiac medications before your next appointment, please call your pharmacy*   Lab Work: None ordered    Testing/Procedures:  None Ordered     Follow-Up: At [facility], you and your health needs are our priority.  As part of our continuing mission to provide you with exceptional heart care, we have created designated Provider Care Teams.  These Care Teams include your primary Cardiologist (physician) and Advanced Practice Providers (APPs -  Physician Assistants and Nurse Practitioners) who all work together to provide you with the care you need, when you need it.  We recommend signing up for the patient portal called "MyChart".  Sign up information is provided on this After Visit Summary.  MyChart is used to connect with patients for Virtual Visits (Telemedicine).  Patients are able to view lab/test results, encounter notes, upcoming appointments, etc.  Non-urgent messages can be sent to your provider as well.   To learn more about what you can do with MyChart, go to [email or web address].    Your next appointment:   3 month(s)  The format for your next appointment:   In Person  Provider:   Kate Sable, MD   Other Instructions

## 2021-01-27 NOTE — Progress Notes (Signed)
Cardiology Office Note:    Date:  01/27/2021   ID:  Lorraine Jimenez, DOB 16-Jan-1961, MRN [id number]  PCP:  Earlie Counts, FNP   [facility] Providers Cardiologist:  None     Referring MD: Earlie Counts, FNP   Chief Complaint  Patient presents with   Other    [facility] F/u NSTEMI no complaints today.  Meds reviewed verbally with pt.    History of Present Illness:    Lorraine Jimenez is a 60 y.o. 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  Previously seen in the hospital about 2 weeks ago with chest pain, NSTEMI.  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  Was medically managed.  Imdur was started for antianginal benefit, Toprol was added.  Echo 01/05/2021 showed EF 55 to 60%. Left heart cath 01/06/2021 distal RCA 80%, consistent with her CAD.  Past Medical History:  Diagnosis Date   Asthma    Cancer ([place])    esophageal   Chronic kidney disease    CKD - Stage 3   Eczema    Fatty liver    GERD (gastroesophageal reflux disease)    Heart murmur    Hypertension    Hypothyroidism    MI (myocardial infarction) ([place])    Multinodular goiter    Sleep apnea     Past Surgical History:  Procedure Laterality Date   ABDOMINAL HYSTERECTOMY     CESAREAN SECTION     COLONOSCOPY     COLONOSCOPY WITH PROPOFOL N/A 03/15/2020   Procedure: COLONOSCOPY WITH PROPOFOL;  Surgeon: Lesly Rubenstein, MD;  Location: [facility] ENDOSCOPY;  Service: Endoscopy;  Laterality: N/A;   DIAGNOSTIC LAPAROSCOPY     ESOPHAGECTOMY     ESOPHAGOGASTRODUODENOSCOPY (EGD) WITH PROPOFOL N/A 03/15/2020   Procedure: ESOPHAGOGASTRODUODENOSCOPY (EGD) WITH PROPOFOL;  Surgeon: Lesly Rubenstein, MD;  Location: [facility] ENDOSCOPY;  Service: Endoscopy;  Laterality: N/A;   EXPLORATORY LAPAROTOMY     LEFT HEART CATH AND CORONARY ANGIOGRAPHY N/A 01/06/2021   Procedure: LEFT HEART CATH AND CORONARY ANGIOGRAPHY;  Surgeon: Andrez Grime, MD;   Location: [place] [facility];  Service: Cardiovascular;  Laterality: N/A;   THYROID LOBECTOMY     TUBAL LIGATION     UPPER GI ENDOSCOPY      Current Medications: Current Meds  Medication Sig   albuterol (VENTOLIN HFA) 108 (90 Base) MCG/ACT inhaler Inhale 2 puffs into the lungs every 6 (six) hours as needed for wheezing or shortness of breath.   aspirin EC 81 MG EC tablet Take 1 tablet (81 mg total) by mouth daily. Swallow whole.   atorvastatin (LIPITOR) 40 MG tablet Take 1 tablet (40 mg total) by mouth daily.   azelastine (ASTELIN) 0.1 % nasal spray Place 2 sprays into both nostrils 2 (two) times daily. Use in each nostril as directed   Calcium Citrate-Vitamin D (CALCIUM CITRATE + PO) Take by mouth daily in the afternoon.   fluticasone (FLONASE) 50 MCG/ACT nasal spray Place into both nostrils daily.   isosorbide mononitrate (IMDUR) 60 MG 24 hr tablet Take 1 tablet (60 mg total) by mouth daily.   levothyroxine (SYNTHROID) 50 MCG tablet Take 50 mcg by mouth daily before breakfast.   metoprolol succinate (TOPROL-XL) 25 MG 24 hr tablet Take 1 tablet (25 mg total) by mouth daily.   nitroGLYCERIN (NITROSTAT) 0.4 MG SL tablet Place 1 tablet (0.4 mg total) under the tongue every 5 (five)  minutes x 3 doses as needed for up to 10 doses for chest pain.   VITAMIN D PO Take by mouth daily.     Allergies:   Patient has no known allergies.   Social History   Socioeconomic History   Marital status: Married    Spouse name: Not on file   Number of children: Not on file   Years of education: Not on file   Highest education level: Not on file  Occupational History   Not on file  Tobacco Use   Smoking status: Never   Smokeless tobacco: Never  Vaping Use   Vaping Use: Never used  Substance and Sexual Activity   Alcohol use: Yes    Alcohol/week: 1.0 standard drink    Types: 1 Standard drinks or equivalent per week   Drug use: Never   Sexual activity: Not on file  Other Topics Concern   Not  on file  Social History Narrative   Not on file   Social Determinants of Health   Financial Resource Strain: Not on file  Food Insecurity: Not on file  Transportation Needs: Not on file  Physical Activity: Not on file  Stress: Not on file  Social Connections: Not on file     Family History: The patient's family history includes COPD in her mother; Coronary artery disease in her father; Diabetes Mellitus II in her mother; Heart attack in her father; Heart disease in her father; Hypertension in her mother and sister; Hypothyroidism in her mother; Leukemia in her father; Thyroid disease in her mother and sister. There is no history of Breast cancer.  ROS:   Please see the history of present illness.     All other systems reviewed and are negative.  EKGs/Labs/Other Studies Reviewed:    The following studies were reviewed today:   EKG:  EKG is  ordered today.  The ekg ordered today demonstrates sinus bradycardia, possible old inferior infarct.  Recent Labs: 01/05/2021: Magnesium 2.2 01/07/2021: BUN 11; Creatinine, Ser 0.93; Hemoglobin 14.4; Platelets 193; Potassium 4.5; Sodium 137  Recent Lipid Panel    Component Value Date/Time   CHOL 161 01/06/2021 0250   TRIG 91 01/06/2021 0250   HDL 44 01/06/2021 0250   CHOLHDL 3.7 01/06/2021 0250   VLDL 18 01/06/2021 0250   LDLCALC 99 01/06/2021 0250     Risk Assessment/Calculations:          Physical Exam:    VS:  BP 140/78 (BP Location: Left Arm, Patient Position: Sitting, Cuff Size: Large)   Pulse (!) 59   Ht '5\' 2"'$  (1.575 m)   Wt 194 lb 6 oz (88.2 kg)   SpO2 97%   BMI 35.55 kg/m     Wt Readings from Last 3 Encounters:  01/27/21 194 lb 6 oz (88.2 kg)  01/06/21 197 lb 14.4 oz (89.8 kg)  03/15/20 199 lb 6.5 oz (90.5 kg)     GEN:  Well nourished, well developed in no acute distress HEENT: Normal NECK: No JVD; No carotid bruits LYMPHATICS: No lymphadenopathy CARDIAC: RRR, no murmurs, rubs, gallops RESPIRATORY:  Clear to  auscultation without rales, wheezing or rhonchi  ABDOMEN: Soft, non-tender, non-distended MUSCULOSKELETAL:  No edema; No deformity  SKIN: Warm and dry NEUROLOGIC:  Alert and oriented x 3 PSYCHIATRIC:  Normal affect   ASSESSMENT:    1. Coronary artery disease involving native coronary artery of native heart without angina pectoris   2. Primary hypertension   3. 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  Denies chest pain.  Continue aspirin, Toprol-XL.  Continue Imdur for antianginal benefit.  Last echo with preserved EF.  Conservative approach will be pursued.  May consider stopping statin in the future since patient does not have dyslipidemia.  If blood pressure becomes an issue, may consider adding amlodipine.  Exercises up to 50 to 70% maximum predicted heart rate to limit arterial shear stress.  Cardiac rehab recommended.  Patient currently undergoing physical therapy and will not be able to perform rehab Hypertension, BP elevated.  Continue Toprol-XL, Imdur for now.  Consider adding Norvasc if BP stays elevated.  Follow-up in 3 months.     Medication Adjustments/Labs and Tests Ordered: Current medicines are reviewed at length with the patient today.  Concerns regarding medicines are outlined above.  Orders Placed This Encounter  Procedures   Ambulatory referral to Pulmonology   EKG 12-Lead    No orders of the defined types were placed in this encounter.   Patient Instructions  Medication Instructions:   Your physician recommends that you continue on your current medications as directed. Please refer to the Current Medication list given to you today.  *If you need a refill on your cardiac medications before your next appointment, please call your pharmacy*   Lab Work: None ordered    Testing/Procedures:  None Ordered     Follow-Up: At [facility], you and your health  needs are our priority.  As part of our continuing mission to provide you with exceptional heart care, we have created designated Provider Care Teams.  These Care Teams include your primary Cardiologist (physician) and Advanced Practice Providers (APPs -  Physician Assistants and Nurse Practitioners) who all work together to provide you with the care you need, when you need it.  We recommend signing up for the patient portal called "MyChart".  Sign up information is provided on this After Visit Summary.  MyChart is used to connect with patients for Virtual Visits (Telemedicine).  Patients are able to view lab/test results, encounter notes, upcoming appointments, etc.  Non-urgent messages can be sent to your provider as well.   To learn more about what you can do with MyChart, go to [email or web address].    Your next appointment:   3 month(s)  The format for your next appointment:   In Person  Provider:   Kate Sable, MD   Other Instructions    Signed, Kate Sable, MD  01/27/2021 5:17 PM    [place]

## 2021-01-29 ENCOUNTER — Observation Stay
Admission: EM | Admit: 2021-01-29 | Discharge: 2021-01-30 | Disposition: A | Discharge status: Home / Self Care | Payer: BC Managed Care – PPO | Attending: Internal Medicine | Admitting: Internal Medicine

## 2021-01-29 ENCOUNTER — Emergency Department: Payer: BC Managed Care – PPO

## 2021-01-29 ENCOUNTER — Other Ambulatory Visit: Payer: Self-pay

## 2021-01-29 DIAGNOSIS — R079 Chest pain, unspecified: Principal | ICD-10-CM | POA: Insufficient documentation

## 2021-01-29 DIAGNOSIS — I249 Acute ischemic heart disease, unspecified: Secondary | ICD-10-CM

## 2021-01-29 DIAGNOSIS — I10 Essential (primary) hypertension: Secondary | ICD-10-CM | POA: Diagnosis present

## 2021-01-29 DIAGNOSIS — N183 Chronic kidney disease, stage 3 unspecified: Secondary | ICD-10-CM | POA: Insufficient documentation

## 2021-01-29 DIAGNOSIS — I2542 Coronary artery dissection: Secondary | ICD-10-CM

## 2021-01-29 DIAGNOSIS — Z7982 Long term (current) use of aspirin: Secondary | ICD-10-CM | POA: Diagnosis not present

## 2021-01-29 DIAGNOSIS — I248 Other forms of acute ischemic heart disease: Secondary | ICD-10-CM

## 2021-01-29 DIAGNOSIS — Z8501 Personal history of malignant neoplasm of esophagus: Secondary | ICD-10-CM | POA: Insufficient documentation

## 2021-01-29 DIAGNOSIS — Z20822 Contact with and (suspected) exposure to covid-19: Secondary | ICD-10-CM | POA: Diagnosis not present

## 2021-01-29 DIAGNOSIS — K219 Gastro-esophageal reflux disease without esophagitis: Secondary | ICD-10-CM | POA: Diagnosis present

## 2021-01-29 DIAGNOSIS — J45909 Unspecified asthma, uncomplicated: Secondary | ICD-10-CM | POA: Diagnosis not present

## 2021-01-29 DIAGNOSIS — I2510 Atherosclerotic heart disease of native coronary artery without angina pectoris: Secondary | ICD-10-CM | POA: Insufficient documentation

## 2021-01-29 DIAGNOSIS — E039 Hypothyroidism, unspecified: Secondary | ICD-10-CM | POA: Diagnosis not present

## 2021-01-29 DIAGNOSIS — Z79899 Other long term (current) drug therapy: Secondary | ICD-10-CM | POA: Diagnosis not present

## 2021-01-29 DIAGNOSIS — I129 Hypertensive chronic kidney disease with stage 1 through stage 4 chronic kidney disease, or unspecified chronic kidney disease: Secondary | ICD-10-CM | POA: Diagnosis not present

## 2021-01-29 LAB — PROTIME-INR
INR: 1 (ref 0.8–1.2)
Prothrombin Time: 13.3 seconds (ref 11.4–15.2)

## 2021-01-29 LAB — CBC
HCT: 41.9 % (ref 36.0–46.0)
HCT: 41.8 % (ref 36.0–46.0)
Hemoglobin: 14.1 g/dL (ref 12.0–15.0)
Hemoglobin: 14 g/dL (ref 12.0–15.0)
MCH: 29.8 pg (ref 26.0–34.0)
MCH: 29.8 pg (ref 26.0–34.0)
MCHC: 33.7 g/dL (ref 30.0–36.0)
MCHC: 33.5 g/dL (ref 30.0–36.0)
MCV: 88.6 fL (ref 80.0–100.0)
MCV: 88.9 fL (ref 80.0–100.0)
Platelets: 193 10*3/uL (ref 150–400)
Platelets: 193 10*3/uL (ref 150–400)
RBC: 4.73 MIL/uL (ref 3.87–5.11)
RBC: 4.7 MIL/uL (ref 3.87–5.11)
RDW: 13.9 % (ref 11.5–15.5)
RDW: 13.9 % (ref 11.5–15.5)
WBC: 6.4 10*3/uL (ref 4.0–10.5)
WBC: 7.1 10*3/uL (ref 4.0–10.5)
nRBC: 0 % (ref 0.0–0.2)
nRBC: 0 % (ref 0.0–0.2)

## 2021-01-29 LAB — BASIC METABOLIC PANEL
Anion gap: 9 (ref 5–15)
BUN: 10 mg/dL (ref 6–20)
CO2: 24 mmol/L (ref 22–32)
Calcium: 8.7 mg/dL — ABNORMAL LOW (ref 8.9–10.3)
Chloride: 105 mmol/L (ref 98–111)
Creatinine, Ser: 0.83 mg/dL (ref 0.44–1.00)
GFR, Estimated: >60 mL/min (ref >60)
Glucose, Bld: 128 mg/dL — ABNORMAL HIGH (ref 70–99)
Potassium: 3.6 mmol/L (ref 3.5–5.1)
Sodium: 138 mmol/L (ref 135–145)

## 2021-01-29 LAB — TROPONIN I (HIGH SENSITIVITY)
Troponin I (High Sensitivity): 34 ng/L — ABNORMAL HIGH (ref <18)
Troponin I (High Sensitivity): 95 ng/L — ABNORMAL HIGH (ref <18)
Troponin I (High Sensitivity): 194 ng/L (ref <18)

## 2021-01-29 LAB — CREATININE, SERUM
Creatinine, Ser: 0.93 mg/dL (ref 0.44–1.00)
GFR, Estimated: >60 mL/min (ref >60)

## 2021-01-29 LAB — RESP PANEL BY RT-PCR (FLU A&B, COVID) ARPGX2
Influenza A by PCR: NEGATIVE
Influenza B by PCR: NEGATIVE
SARS Coronavirus 2 by RT PCR: NEGATIVE

## 2021-01-29 MED ORDER — ISOSORBIDE MONONITRATE ER 60 MG PO TB24: 60.0000 mg | ORAL_TABLET | Freq: Every day | ORAL | Status: DC

## 2021-01-29 MED ORDER — ONDANSETRON HCL 4 MG/2ML IJ SOLN: 4.0000 mg | Freq: Four times a day (QID) | INTRAMUSCULAR | Status: DC | PRN

## 2021-01-29 MED ORDER — ATORVASTATIN CALCIUM 20 MG PO TABS: 40.0000 mg | ORAL_TABLET | Freq: Every day | ORAL | Status: DC

## 2021-01-29 MED ORDER — ALBUTEROL SULFATE HFA 108 (90 BASE) MCG/ACT IN AERS: 2.0000 | INHALATION_SPRAY | Freq: Four times a day (QID) | RESPIRATORY_TRACT | Status: DC | PRN

## 2021-01-29 MED ORDER — CALCIUM CITRATE-VITAMIN D 500-500 MG-UNIT PO CHEW
1.0000 | CHEWABLE_TABLET | Freq: Every day | ORAL | Status: DC
  Administered 2021-01-29: 1 via ORAL
  Filled 2021-01-29 (×2): qty 1

## 2021-01-29 MED ORDER — NITROGLYCERIN 0.4 MG SL SUBL: 0.4000 mg | SUBLINGUAL_TABLET | SUBLINGUAL | Status: DC | PRN

## 2021-01-29 MED ORDER — ASPIRIN EC 81 MG PO TBEC: 81.0000 mg | DELAYED_RELEASE_TABLET | Freq: Every day | ORAL | Status: DC

## 2021-01-29 MED ORDER — ENOXAPARIN SODIUM 60 MG/0.6ML IJ SOSY
0.5000 mg/kg | PREFILLED_SYRINGE | INTRAMUSCULAR | Status: DC
  Administered 2021-01-29: 45 mg via SUBCUTANEOUS
  Filled 2021-01-29: qty 0.6

## 2021-01-29 MED ORDER — ASPIRIN 300 MG RE SUPP: 300.0000 mg | RECTAL | Status: DC

## 2021-01-29 MED ORDER — ISOSORBIDE MONONITRATE ER 60 MG PO TB24
60.0000 mg | ORAL_TABLET | Freq: Once | ORAL | Status: AC
  Administered 2021-01-29: 60 mg via ORAL
  Filled 2021-01-29: qty 1

## 2021-01-29 MED ORDER — VITAMIN D 25 MCG (1000 UNIT) PO TABS: 1000.0000 [IU] | ORAL_TABLET | Freq: Every day | ORAL | Status: DC

## 2021-01-29 MED ORDER — METOPROLOL SUCCINATE ER 50 MG PO TB24: 25.0000 mg | ORAL_TABLET | Freq: Every day | ORAL | Status: DC

## 2021-01-29 MED ORDER — ISOSORBIDE MONONITRATE ER 60 MG PO TB24: 120.0000 mg | ORAL_TABLET | Freq: Every day | ORAL | Status: DC

## 2021-01-29 MED ORDER — LEVOTHYROXINE SODIUM 50 MCG PO TABS
50.0000 ug | ORAL_TABLET | Freq: Every day | ORAL | Status: DC
  Administered 2021-01-30: 50 ug via ORAL
  Filled 2021-01-29 (×2): qty 1

## 2021-01-29 MED ORDER — ASPIRIN 81 MG PO TBEC: 81.0000 mg | DELAYED_RELEASE_TABLET | Freq: Every day | ORAL | Status: DC

## 2021-01-29 MED ORDER — ACETAMINOPHEN 325 MG PO TABS
650.0000 mg | ORAL_TABLET | ORAL | Status: DC | PRN
  Administered 2021-01-29: 650 mg via ORAL
  Filled 2021-01-29 (×2): qty 2

## 2021-01-29 MED ORDER — ASPIRIN 81 MG PO CHEW: 324.0000 mg | CHEWABLE_TABLET | ORAL | Status: DC

## 2021-01-29 MED ORDER — ALBUTEROL SULFATE (2.5 MG/3ML) 0.083% IN NEBU: 2.5000 mg | INHALATION_SOLUTION | Freq: Four times a day (QID) | RESPIRATORY_TRACT | Status: DC | PRN

## 2021-01-29 NOTE — Consult Note (Signed)
Cardiology Consultation:   Patient ID: [name] MRN: [id number]; DOB: [birth year]  Admit date: 01/29/2021 Date of Consult: 01/29/2021  PCP:  Lorraine Jimenez, [place] Providers Cardiologist:  Lorraine Sable, MD        Patient Profile:   Lorraine Jimenez is a 60 y.o. female with a hx of CAD who is being seen 01/29/2021 for the evaluation of chest pain at the request of Dr. Marcello Jimenez.  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    Patient was at home, working on her desk when symptoms began.  Describes having sharp left-sided chest discomfort, which was slightly worse to when she had an MI 3 weeks ago.  Initial symptoms were about 10 out of 10 in severity.  She took a nitroglycerin pill, with symptom improvement to a 5 out of 10 in severity 5 to 10 minutes later.  Describes current symptoms as slight/1 out of 10.  Has done relatively okay since hospital discharge 3 weeks ago, was seen by myself in the office 2 days ago.  Took all her medications as prescribed.  Initial EKG showed normal sinus rhythm, possible old inferior infarct.  Troponins were 34, 95.   Past Medical History:  Diagnosis Date   Asthma    Cancer ([place])    esophageal   Chronic kidney disease    CKD - Stage 3   Eczema    Fatty liver    GERD (gastroesophageal reflux disease)    Heart murmur    Hypertension    Hypothyroidism    MI (myocardial infarction) ([place])    Multinodular goiter    Sleep apnea     Past Surgical History:  Procedure Laterality Date   ABDOMINAL HYSTERECTOMY     CESAREAN SECTION     COLONOSCOPY     COLONOSCOPY WITH PROPOFOL N/A 03/15/2020   Procedure: COLONOSCOPY WITH PROPOFOL;  Surgeon: Lesly Rubenstein, MD;  Location: [facility] ENDOSCOPY;  Service: Endoscopy;  Laterality: N/A;   DIAGNOSTIC LAPAROSCOPY     ESOPHAGECTOMY     ESOPHAGOGASTRODUODENOSCOPY (EGD) WITH  PROPOFOL N/A 03/15/2020   Procedure: ESOPHAGOGASTRODUODENOSCOPY (EGD) WITH PROPOFOL;  Surgeon: Lesly Rubenstein, MD;  Location: [facility] ENDOSCOPY;  Service: Endoscopy;  Laterality: N/A;   EXPLORATORY LAPAROTOMY     LEFT HEART CATH AND CORONARY ANGIOGRAPHY N/A 01/06/2021   Procedure: LEFT HEART CATH AND CORONARY ANGIOGRAPHY;  Surgeon: Andrez Grime, MD;  Location: [place] [facility];  Service: Cardiovascular;  Laterality: N/A;   THYROID LOBECTOMY     TUBAL LIGATION     UPPER GI ENDOSCOPY       Home Medications:  Prior to Admission medications   Medication Sig Start Date End Date Taking? Authorizing Provider  albuterol (VENTOLIN HFA) 108 (90 Base) MCG/ACT inhaler Inhale 2 puffs into the lungs every 6 (six) hours as needed for wheezing or shortness of breath.   Yes [provider]  aspirin EC 81 MG EC tablet Take 1 tablet (81 mg total) by mouth daily. Swallow whole. 01/09/21 04/09/21 Yes Dahal, Marlowe Aschoff, MD  atorvastatin (LIPITOR) 40 MG tablet Take 1 tablet (40 mg total) by mouth daily. 01/09/21 04/09/21 Yes Dahal, Marlowe Aschoff, MD  azelastine (ASTELIN) 0.1 % nasal spray Place 2 sprays into both nostrils 2 (two) times daily. Use in each nostril as directed   Yes [provider]  Calcium Citrate-Vitamin D (CALCIUM CITRATE +  PO) Take by mouth daily in the afternoon.   Yes [provider]  fluticasone (FLONASE) 50 MCG/ACT nasal spray Place into both nostrils daily.   Yes [provider]  isosorbide mononitrate (IMDUR) 60 MG 24 hr tablet Take 1 tablet (60 mg total) by mouth daily. 01/09/21 04/09/21 Yes Dahal, Marlowe Aschoff, MD  levothyroxine (SYNTHROID) 50 MCG tablet Take 50 mcg by mouth daily before breakfast.   Yes [provider]  metoprolol succinate (TOPROL-XL) 25 MG 24 hr tablet Take 1 tablet (25 mg total) by mouth daily. 01/09/21 04/09/21 Yes Dahal, Marlowe Aschoff, MD  nitroGLYCERIN (NITROSTAT) 0.4 MG SL tablet Place 1 tablet (0.4 mg total) under the tongue every 5 (five)  minutes x 3 doses as needed for up to 10 doses for chest pain. 01/08/21  Yes Dahal, Marlowe Aschoff, MD  VITAMIN D PO Take by mouth daily.   Yes [provider]    Inpatient Medications: Scheduled Meds:  [START ON 01/30/2021] aspirin EC  81 mg Oral Daily   [START ON 01/30/2021] atorvastatin  40 mg Oral Daily   calcium citrate-vitamin D  1 tablet Oral Q1500   [START ON 01/30/2021] cholecalciferol  1,000 Units Oral Daily   enoxaparin (LOVENOX) injection  0.5 mg/kg Subcutaneous Q24H   [START ON 01/30/2021] isosorbide mononitrate  120 mg Oral Daily   [START ON 01/30/2021] levothyroxine  50 mcg Oral Q0600   [START ON 01/30/2021] metoprolol succinate  25 mg Oral Daily   Continuous Infusions:  PRN Meds: acetaminophen, albuterol, nitroGLYCERIN, ondansetron (ZOFRAN) IV  Allergies:   No Known Allergies  Social History:   Social History   Socioeconomic History   Marital status: Married    Spouse name: Not on file   Number of children: Not on file   Years of education: Not on file   Highest education level: Not on file  Occupational History   Not on file  Tobacco Use   Smoking status: Never   Smokeless tobacco: Never  Vaping Use   Vaping Use: Never used  Substance and Sexual Activity   Alcohol use: Yes    Alcohol/week: 1.0 standard drink    Types: 1 Standard drinks or equivalent per week   Drug use: Never   Sexual activity: Not on file  Other Topics Concern   Not on file  Social History Narrative   Not on file   Social Determinants of Health   Financial Resource Strain: Not on file  Food Insecurity: Not on file  Transportation Needs: Not on file  Physical Activity: Not on file  Stress: Not on file  Social Connections: Not on file  Intimate Partner Violence: Not on file    Family History:    Family History  Problem Relation Age of Onset   Hypothyroidism Mother    COPD Mother    Diabetes Mellitus II Mother    Hypertension Mother    Thyroid disease Mother    Heart attack  Father    Heart disease Father    Leukemia Father    Coronary artery disease Father    Hypertension Sister    Thyroid disease Sister    Breast cancer Neg Hx      ROS:  Please see the history of present illness.   All other ROS reviewed and negative.     Physical Exam/Data:   Vitals:   01/29/21 1300 01/29/21 1330 01/29/21 1400 01/29/21 1630  BP: (!) 98/58 127/65 (!) 141/76 131/75  Pulse: 63 64 (!) 53 61  Resp: 14  $'18 17 19  'C$ Temp:      TempSrc:      SpO2: 98% 96% 97% 95%  Weight:      Height:       No intake or output data in the 24 hours ending 01/29/21 1722 Last 3 Weights 01/29/2021 01/27/2021 01/06/2021  Weight (lbs) 196 lb 194 lb 6 oz 197 lb 14.4 oz  Weight (kg) 88.905 kg 88.168 kg 89.767 kg     Body mass index is 35.85 kg/m.  General:  Well nourished, well developed, in no acute distress HEENT: normal Lymph: no adenopathy Neck: no JVD Endocrine:  No thryomegaly Vascular: No carotid bruits; FA pulses 2+ bilaterally without bruits  Cardiac:  normal S1, S2; RRR; no murmur  Lungs:  clear to auscultation bilaterally, no wheezing, rhonchi or rales  Abd: soft, nontender, no hepatomegaly  Ext: no edema Musculoskeletal:  No deformities, BUE and BLE strength normal and equal Skin: warm and dry  Neuro:  CNs 2-12 intact, no focal abnormalities noted Psych:  Normal affect   EKG:  The EKG was personally reviewed and demonstrates: Sinus rhythm, old inferior infarct Telemetry:  Telemetry was personally reviewed and demonstrates: Sinus rhythm  Relevant CV Studies: TTE 01/05/2021 1. Left ventricular ejection fraction, by estimation, is 55 to 60%. The  left ventricle has normal function. The left ventricle has no regional  wall motion abnormalities. Left ventricular diastolic parameters were  normal.   2. Right ventricular systolic function is normal. The right ventricular  size is normal. Tricuspid regurgitation signal is inadequate for assessing  PA pressure.   3. The mitral  valve is normal in structure. Trivial mitral valve  regurgitation. No evidence of mitral stenosis.   4. The aortic valve is normal in structure. Aortic valve regurgitation is  not visualized. No aortic stenosis is present.   5. The inferior vena cava is normal in size with greater than 50%  respiratory variability, suggesting right atrial pressure of 3 mmHg.   Lhc 01/06/2021   Dist RCA lesion is 80% stenosed.   The left ventricular systolic function is normal.   LV end diastolic pressure is mildly elevated.   The left ventricular ejection fraction is greater than 65% by visual estimate.   There is no aortic valve stenosis.   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   RECOMMENDATIONS: Medical management of likely SCAD with at least 1 year of antiplatelet therapy.  If she has recurrent chest discomfort or exertional chest discomfort in the future, we can consider repeat angiography in 6 weeks though this may not be necessary. Ambulatory referral to cardiac rehab placed  Laboratory Data:  High Sensitivity Troponin:   Recent Labs  Lab 01/07/21 1402 01/07/21 1545 01/08/21 0617 01/29/21 0949 01/29/21 1148  TROPONINIHS 3,783* 4,227* 4,142* 34* 95*     Chemistry Recent Labs  Lab 01/29/21 0949  NA 138  K 3.6  CL 105  CO2 24  GLUCOSE 128*  BUN 10  CREATININE 0.83  CALCIUM 8.7*  GFRNONAA >60  ANIONGAP 9    No results for input(s): PROT, ALBUMIN, AST, ALT, ALKPHOS, BILITOT in the last 168 hours. Hematology Recent Labs  Lab 01/29/21 0949  WBC 6.4  RBC 4.73  HGB 14.1  HCT 41.9  MCV 88.6  MCH 29.8  MCHC 33.7  RDW 13.9  PLT 193   BNPNo results for input(s): BNP, PROBNP in the last 168 hours.  DDimer No results for input(s): DDIMER in the last 168 hours.   Radiology/Studies:  DG Chest 2 View  Result Date: 01/29/2021 CLINICAL DATA:  Pt to ED [facility] from  home for cp that started this am. Took ASA and nitro Pt has had 2 NSTEMI this month. cp EXAM: CHEST - 2 VIEW COMPARISON:  01/05/2021, CT FINDINGS: Normal cardiac silhouette. Chronic elevation of the LEFT hemidiaphragm. Opacity on lateral projection associated with gastric pull up at the RIGHT lung base. There is atelectasis at the LEFT lung base similar prior. Upper lungs clear. IMPRESSION: 1. No interval change. 2. Elevation LEFT hemidiaphragm with LEFT basilar atelectasis. 3. Gastric pull up noted at the RIGHT lung base. Electronically Signed   By: Suzy Bouchard M.D.   On: 01/29/2021 10:35     Assessment and Plan:   Chest pain, history of SCAD -Troponin elevation likely reflect demand supply mismatch. -Symptoms improved with nitro -Continue conservative management for recent SCAD -Take extra dose of 60 mg Imdur x1 this evening. -Increase Imdur to 120 mg daily. -She is adequately beta blocked.  Continue Toprol-XL 25 mg daily, aspirin 81 -If she stays asymptomatic with ambulation in the a.m., discharge on higher dose of Imdur. -If chest pain returns or persists, consider Lexiscan versus repeat left heart cath. -We will keep patient n.p.o. after midnight  2.  Hypertension -BP controlled.  Continue Toprol-XL, Imdur.  3.  Possible OSA -Outpatient referral was placed to pulmonary medicine -Try to [facility] appointment  Total encounter time 110 minutes  Greater than 50% was spent in counseling and coordination of care with the patient   Signed, Lorraine Sable, MD  01/29/2021 5:22 PM

## 2021-01-29 NOTE — ED Provider Notes (Signed)
[facility] Emergency Department Provider Note    Event Date/Time   First MD Initiated Contact with Patient 01/29/21 1036     (approximate)  I have reviewed the triage vital signs and the nursing notes.   HISTORY  Chief Complaint Chest Pain    HPI Bineta B Mervine is a 60 y.o. female with below listed past medical history presents to the ER for evaluation of midsternal nonradiating chest pain and pressure that started this morning.  Abdomen anticlockwise she is preparing for a meeting.  Describes it as pressure similar in nature to when she had MI 1 month ago but did not have the same pitting radiating to her left arm.  States it was more severe than previous.  States that she been compliant with her medication.  No fevers or chills.  No nausea or vomiting.  Past Medical History:  Diagnosis Date   Asthma    Cancer ([place])    esophageal   Chronic kidney disease    CKD - Stage 3   Eczema    Fatty liver    GERD (gastroesophageal reflux disease)    Heart murmur    Hypertension    Hypothyroidism    MI (myocardial infarction) ([place])    Multinodular goiter    Sleep apnea    Family History  Problem Relation Age of Onset   Hypothyroidism Mother    COPD Mother    Diabetes Mellitus II Mother    Hypertension Mother    Thyroid disease Mother    Heart attack Father    Heart disease Father    Leukemia Father    Coronary artery disease Father    Hypertension Sister    Thyroid disease Sister    Breast cancer Neg Hx    Past Surgical History:  Procedure Laterality Date   ABDOMINAL HYSTERECTOMY     CESAREAN SECTION     COLONOSCOPY     COLONOSCOPY WITH PROPOFOL N/A 03/15/2020   Procedure: COLONOSCOPY WITH PROPOFOL;  Surgeon: Lesly Rubenstein, MD;  Location: [facility] ENDOSCOPY;  Service: Endoscopy;  Laterality: N/A;   DIAGNOSTIC LAPAROSCOPY     ESOPHAGECTOMY     ESOPHAGOGASTRODUODENOSCOPY (EGD) WITH PROPOFOL N/A 03/15/2020   Procedure:  ESOPHAGOGASTRODUODENOSCOPY (EGD) WITH PROPOFOL;  Surgeon: Lesly Rubenstein, MD;  Location: [facility] ENDOSCOPY;  Service: Endoscopy;  Laterality: N/A;   EXPLORATORY LAPAROTOMY     LEFT HEART CATH AND CORONARY ANGIOGRAPHY N/A 01/06/2021   Procedure: LEFT HEART CATH AND CORONARY ANGIOGRAPHY;  Surgeon: Andrez Grime, MD;  Location: [place] [facility];  Service: Cardiovascular;  Laterality: N/A;   THYROID LOBECTOMY     TUBAL LIGATION     UPPER GI ENDOSCOPY     Patient Active Problem List   Diagnosis Date Noted   NSTEMI (non-ST elevated myocardial infarction) ([place]) 01/05/2021   Primary hypertension    GERD (gastroesophageal reflux disease)    Hypothyroidism    Sleep apnea    Obesity (BMI 30-39.9)       Prior to Admission medications   Medication Sig Start Date End Date Taking? Authorizing Provider  albuterol (VENTOLIN HFA) 108 (90 Base) MCG/ACT inhaler Inhale 2 puffs into the lungs every 6 (six) hours as needed for wheezing or shortness of breath.    [provider]  aspirin EC 81 MG EC tablet Take 1 tablet (81 mg total) by mouth daily. Swallow whole. 01/09/21 04/09/21  Terrilee Croak, MD  atorvastatin (LIPITOR) 40 MG tablet Take 1 tablet (40 mg  total) by mouth daily. 01/09/21 04/09/21  Terrilee Croak, MD  azelastine (ASTELIN) 0.1 % nasal spray Place 2 sprays into both nostrils 2 (two) times daily. Use in each nostril as directed    [provider]  Calcium Citrate-Vitamin D (CALCIUM CITRATE + PO) Take by mouth daily in the afternoon.    [provider]  fluticasone (FLONASE) 50 MCG/ACT nasal spray Place into both nostrils daily.    [provider]  isosorbide mononitrate (IMDUR) 60 MG 24 hr tablet Take 1 tablet (60 mg total) by mouth daily. 01/09/21 04/09/21  Terrilee Croak, MD  levothyroxine (SYNTHROID) 50 MCG tablet Take 50 mcg by mouth daily before breakfast.    [provider]  metoprolol succinate (TOPROL-XL) 25 MG 24 hr tablet Take 1 tablet (25  mg total) by mouth daily. 01/09/21 04/09/21  Terrilee Croak, MD  nitroGLYCERIN (NITROSTAT) 0.4 MG SL tablet Place 1 tablet (0.4 mg total) under the tongue every 5 (five) minutes x 3 doses as needed for up to 10 doses for chest pain. 01/08/21   Terrilee Croak, MD  VITAMIN D PO Take by mouth daily.    [provider]    Allergies Patient has no known allergies.    Social History Social History   Tobacco Use   Smoking status: Never   Smokeless tobacco: Never  Vaping Use   Vaping Use: Never used  Substance Use Topics   Alcohol use: Yes    Alcohol/week: 1.0 standard drink    Types: 1 Standard drinks or equivalent per week   Drug use: Never    Review of Systems Patient denies headaches, rhinorrhea, blurry vision, numbness, shortness of breath, chest pain, edema, cough, abdominal pain, nausea, vomiting, diarrhea, dysuria, fevers, rashes or hallucinations unless otherwise stated above in HPI. ____________________________________________   PHYSICAL EXAM:  VITAL SIGNS: Vitals:   01/29/21 1100 01/29/21 1200  BP: (!) 145/71 137/68  Pulse: (!) 55 (!) 49  Resp: 16 16  Temp:    SpO2: 94% 96%    Constitutional: Alert and oriented.  Eyes: Conjunctivae are normal.  Head: Atraumatic. Nose: No congestion/rhinnorhea. Mouth/Throat: Mucous membranes are moist.   Neck: No stridor. Painless ROM.  Cardiovascular: Normal rate, regular rhythm. Grossly normal heart sounds.  Good peripheral circulation. Respiratory: Normal respiratory effort.  No retractions. Lungs CTAB. Gastrointestinal: Soft and nontender. No distention. No abdominal bruits. No CVA tenderness. Genitourinary:  Musculoskeletal: No lower extremity tenderness nor edema.  No joint effusions. Neurologic:  Normal speech and language. No gross focal neurologic deficits are appreciated. No facial droop Skin:  Skin is warm, dry and intact. No rash noted. Psychiatric: Mood and affect are normal. Speech and behavior are  normal.  ____________________________________________   LABS (all labs ordered are listed, but only abnormal results are displayed)  Results for orders placed or performed during the hospital encounter of 01/29/21 (from the past 24 hour(s))  Basic metabolic panel     Status: Abnormal   Collection Time: 01/29/21  9:49 AM  Result Value Ref Range   Sodium 138 135 - 145 mmol/L   Potassium 3.6 3.5 - 5.1 mmol/L   Chloride 105 98 - 111 mmol/L   CO2 24 22 - 32 mmol/L   Glucose, Bld 128 (H) 70 - 99 mg/dL   BUN 10 6 - 20 mg/dL   Creatinine, Ser 0.83 0.44 - 1.00 mg/dL   Calcium 8.7 (L) 8.9 - 10.3 mg/dL   GFR, Estimated >60 >60 mL/min   Anion gap 9 5 -  15  CBC     Status: None   Collection Time: 01/29/21  9:49 AM  Result Value Ref Range   WBC 6.4 4.0 - 10.5 K/uL   RBC 4.73 3.87 - 5.11 MIL/uL   Hemoglobin 14.1 12.0 - 15.0 g/dL   HCT 41.9 36.0 - 46.0 %   MCV 88.6 80.0 - 100.0 fL   MCH 29.8 26.0 - 34.0 pg   MCHC 33.7 30.0 - 36.0 g/dL   RDW 13.9 11.5 - 15.5 %   Platelets 193 150 - 400 K/uL   nRBC 0.0 0.0 - 0.2 %  Troponin I (High Sensitivity)     Status: Abnormal   Collection Time: 01/29/21  9:49 AM  Result Value Ref Range   Troponin I (High Sensitivity) 34 (H) <18 ng/L  Protime-INR (order if Patient is taking Coumadin / Warfarin)     Status: None   Collection Time: 01/29/21  9:49 AM  Result Value Ref Range   Prothrombin Time 13.3 11.4 - 15.2 seconds   INR 1.0 0.8 - 1.2  Troponin I (High Sensitivity)     Status: Abnormal   Collection Time: 01/29/21 11:48 AM  Result Value Ref Range   Troponin I (High Sensitivity) 95 (H) <18 ng/L   ____________________________________________  EKG My review and personal interpretation at Time: 9:45   Indication: chestpain  Rate: 60  Rhythm: sinus Axis: normal Other: ineror twi, no stemi ____________________________________________  RADIOLOGY  I personally reviewed all radiographic images ordered to evaluate for the above acute complaints and  reviewed radiology reports and findings.  These findings were personally discussed with the patient.  Please see medical record for radiology report.  ____________________________________________   PROCEDURES  Procedure(s) performed:  Procedures    Critical Care performed: no ____________________________________________   INITIAL IMPRESSION / ASSESSMENT AND PLAN / ED COURSE  Pertinent labs & imaging results that were available during my care of the patient were reviewed by me and considered in my medical decision making (see chart for details).   DDX: ACS, pericarditis, esophagitis, boerhaaves, pe, dissection, pna, bronchitis, costochondritis   Krystyne B [place] is a 60 y.o. who presents to the ED with presentation as described above.  Patient with recent admission for NSTEMI and scad.  Has been compliant with her medications.  Rates her pain currently is 1 out of 10 in severity.  Blood work sent for the above differential.  Initial troponin borderline elevated will consult cardiology.  Clinical Course as of 01/29/21 1245  Wed Jan 29, 2021  1139 I discussed case in consultation with Dr. Garen Lah regarding her presentation.  She is currently with minimal discomfort will observe for serial enzymes. [PR]  [id number] Troponin did elevate.  She is currently pain-free but given the episode of pain earlier today do feel she will require observation in the hospital.  Spoken with cardiology who recommends holding heparin at this time agrees with plan for hospitalization. [PR]    Clinical Course User Index [PR] Merlyn Lot, MD    The patient was evaluated in Emergency Department today for the symptoms described in the history of present illness. He/she was evaluated in the context of the global COVID-19 pandemic, which necessitated consideration that the patient might be at risk for infection with the SARS-CoV-2 virus that causes COVID-19. Institutional protocols and algorithms that  pertain to the evaluation of patients at risk for COVID-19 are in a state of rapid change based on information released by regulatory bodies including the CDC and federal and  state organizations. These policies and algorithms were followed during the patient's care in the ED.  As part of my medical decision making, I reviewed the following data within the [place] notes reviewed and incorporated, Labs reviewed, notes from prior ED visits and [place] Controlled Substance Database   ____________________________________________   FINAL CLINICAL IMPRESSION(S) / ED DIAGNOSES  Final diagnoses:  Chest pain, unspecified type      NEW MEDICATIONS STARTED DURING THIS VISIT:  New Prescriptions   No medications on file     Note:  This document was prepared using Dragon voice recognition software and may include unintentional dictation errors.    Merlyn Lot, MD 01/29/21 1245

## 2021-01-29 NOTE — Progress Notes (Signed)
PHARMACIST - PHYSICIAN COMMUNICATION  CONCERNING:  Enoxaparin (Lovenox) for DVT Prophylaxis    RECOMMENDATION: Patient was prescribed enoxaprin '40mg'$  q24 hours for VTE prophylaxis.   Filed Weights   01/29/21 0946  Weight: 88.9 kg (196 lb)    Body mass index is 35.85 kg/m.  Estimated Creatinine Clearance: 75.6 mL/min (by C-G formula based on SCr of 0.83 mg/dL).   Based on [facility] patient is candidate for enoxaparin 0.'5mg'$ /kg TBW SQ every 24 hours based on BMI being >30.   DESCRIPTION: Pharmacy has adjusted enoxaparin dose per [facility] policy.  Patient is now receiving enoxaparin 45 mg every 24 hours    Sherilyn Banker, PharmD Clinical Pharmacist  01/29/2021 5:05 PM

## 2021-01-29 NOTE — ED Notes (Signed)
Patient transported to X-ray 

## 2021-01-29 NOTE — H&P (Addendum)
History and Physical    Lorraine Jimenez [id number] DOB: [birth year] DOA: 01/29/2021  PCP: Earlie Counts, FNP  Patient coming from: home  I have personally briefly reviewed patient's old medical records in [place]  Chief Complaint: chest pain   HPI: Lorraine Jimenez is a 60 y.o. 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 Patient echo at that time noted ef of 55-60% patient  was discharged and has since had follow up  with cardiology 2 days ago.  At that time she was noted to be in stable condition and no medication changes were made. Patient presents to ed with severe chest pain that started this am and on evaluation was found to have+ ce  with delta of 61.  Per patient she was working at her desk around 9 am and started having chest pains.  Patient states chest was  located on the left side of her chest,.She describes it as sharp pain with radiation  to her arm.  Patient states she also has episode of dizziness. She notes  that pain at maximum was 10 /10 but after nitro was a 5/10.  She  denies any fainting , n/v/sweating/ or pleuritic compoement  to chest pain. She notes that she has been complaint with all her medications. She currently states that she is chest pain free.  ED Course: Vitals: Afeb, bp 145/75, hr 63, rr16, sat 94%  Labs: wbc 6.4, hgb 14.1, plt193 Na138, K3.6, co2 :24, glu128, cr 0.83 Ce 34,95, Inr 1  EKG: q/twave inversion III/AVF Cxr:. No interval change. 2. Elevation LEFT hemidiaphragm with LEFT basilar atelectasis. 3. Gastric pull up noted at the RIGHT lung base.   Review of Systems: As per HPI otherwise 10 point review of systems negative.   Past Medical History:  Diagnosis Date   Asthma    Cancer ([place])    esophageal   Chronic kidney disease    CKD - Stage 3    Eczema    Fatty liver    GERD (gastroesophageal reflux disease)    Heart murmur    Hypertension    Hypothyroidism    MI (myocardial infarction) ([place])    Multinodular goiter    Sleep apnea     Past Surgical History:  Procedure Laterality Date   ABDOMINAL HYSTERECTOMY     CESAREAN SECTION     COLONOSCOPY     COLONOSCOPY WITH PROPOFOL N/A 03/15/2020   Procedure: COLONOSCOPY WITH PROPOFOL;  Surgeon: Lesly Rubenstein, MD;  Location: [facility] ENDOSCOPY;  Service: Endoscopy;  Laterality: N/A;   DIAGNOSTIC LAPAROSCOPY     ESOPHAGECTOMY     ESOPHAGOGASTRODUODENOSCOPY (EGD) WITH PROPOFOL N/A 03/15/2020   Procedure: ESOPHAGOGASTRODUODENOSCOPY (EGD) WITH PROPOFOL;  Surgeon: Lesly Rubenstein, MD;  Location: [facility] ENDOSCOPY;  Service: Endoscopy;  Laterality: N/A;   EXPLORATORY LAPAROTOMY     LEFT HEART CATH AND CORONARY ANGIOGRAPHY N/A 01/06/2021   Procedure: LEFT HEART CATH AND CORONARY ANGIOGRAPHY;  Surgeon: Andrez Grime, MD;  Location: [place] [facility];  Service: Cardiovascular;  Laterality: N/A;   THYROID LOBECTOMY     TUBAL LIGATION     UPPER GI ENDOSCOPY       reports that she has never smoked. She has never used smokeless tobacco. She reports current alcohol use of about 1.0 standard drink per week. She  reports that she does not use drugs.  No Known Allergies  Family History  Problem Relation Age of Onset   Hypothyroidism Mother    COPD Mother    Diabetes Mellitus II Mother    Hypertension Mother    Thyroid disease Mother    Heart attack Father    Heart disease Father    Leukemia Father    Coronary artery disease Father    Hypertension Sister    Thyroid disease Sister    Breast cancer Neg Hx    Prior to Admission medications   Medication Sig Start Date End Date Taking? Authorizing Provider  albuterol (VENTOLIN HFA) 108 (90 Base) MCG/ACT inhaler Inhale 2 puffs into the lungs every 6 (six) hours as needed for wheezing or shortness of breath.    [provider]  aspirin EC 81 MG EC tablet Take 1 tablet (81 mg total) by mouth daily. Swallow whole. 01/09/21 04/09/21  Terrilee Croak, MD  atorvastatin (LIPITOR) 40 MG tablet Take 1 tablet (40 mg total) by mouth daily. 01/09/21 04/09/21  Terrilee Croak, MD  azelastine (ASTELIN) 0.1 % nasal spray Place 2 sprays into both nostrils 2 (two) times daily. Use in each nostril as directed    [provider]  Calcium Citrate-Vitamin D (CALCIUM CITRATE + PO) Take by mouth daily in the afternoon.    [provider]  fluticasone (FLONASE) 50 MCG/ACT nasal spray Place into both nostrils daily.    [provider]  isosorbide mononitrate (IMDUR) 60 MG 24 hr tablet Take 1 tablet (60 mg total) by mouth daily. 01/09/21 04/09/21  Terrilee Croak, MD  levothyroxine (SYNTHROID) 50 MCG tablet Take 50 mcg by mouth daily before breakfast.    [provider]  metoprolol succinate (TOPROL-XL) 25 MG 24 hr tablet Take 1 tablet (25 mg total) by mouth daily. 01/09/21 04/09/21  Terrilee Croak, MD  nitroGLYCERIN (NITROSTAT) 0.4 MG SL tablet Place 1 tablet (0.4 mg total) under the tongue every 5 (five) minutes x 3 doses as needed for up to 10 doses for chest pain. 01/08/21   Terrilee Croak, MD  VITAMIN D PO Take by mouth daily.    [provider]    Physical Exam: Vitals:   01/29/21 0948 01/29/21 1100 01/29/21 1200 01/29/21 1255  BP: (!) 145/75 (!) 145/71 137/68 130/73  Pulse: 63 (!) 55 (!) 49 (!) 57  Resp: '16 16 16 '$ (!) 23  Temp: 98.2 F (36.8 C)     TempSrc: Oral     SpO2: 94% 94% 96% 99%  Weight:      Height:        Constitutional: NAD, calm, comfortable Vitals:   01/29/21 0948 01/29/21 1100 01/29/21 1200 01/29/21 1255  BP: (!) 145/75 (!) 145/71 137/68 130/73  Pulse: 63 (!) 55 (!) 49 (!) 57  Resp: '16 16 16 '$ (!) 23  Temp: 98.2 F (36.8 C)     TempSrc: Oral     SpO2: 94% 94% 96% 99%  Weight:      Height:       Eyes: PERRL, lids and conjunctivae normal ENMT: Mucous membranes are  moist. Posterior pharynx clear of any exudate or lesions.Normal dentition.  Neck: normal, supple, no masses, no thyromegaly Respiratory: clear to auscultation bilaterally, no wheezing, no crackles. Normal respiratory effort. No accessory muscle use.  Cardiovascular: Regular rate and rhythm, no murmurs / rubs / gallops. No extremity edema. 2+ pedal pulses. No carotid bruits.  Abdomen: no tenderness, no masses palpated. No hepatosplenomegaly.  Bowel sounds positive.  Musculoskeletal: no clubbing / cyanosis. No joint deformity upper and lower extremities. Good ROM, no contractures. Normal muscle tone.  Skin: no rashes, lesions, ulcers. No induration Neurologic: CN 2-12 grossly intact. Sensation intact,  Strength 5/5 in all 4.  Psychiatric: Normal judgment and insight. Alert and oriented x 3. Normal mood.    Labs on Admission: I have personally reviewed following labs and imaging studies  CBC: Recent Labs  Lab 01/29/21 0949  WBC 6.4  HGB 14.1  HCT 41.9  MCV 88.6  PLT 0000000   Basic Metabolic Panel: Recent Labs  Lab 01/29/21 0949  NA 138  K 3.6  CL 105  CO2 24  GLUCOSE 128*  BUN 10  CREATININE 0.83  CALCIUM 8.7*   GFR: Estimated Creatinine Clearance: 75.6 mL/min (by C-G formula based on SCr of 0.83 mg/dL). Liver Function Tests: No results for input(s): AST, ALT, ALKPHOS, BILITOT, PROT, ALBUMIN in the last 168 hours. No results for input(s): LIPASE, AMYLASE in the last 168 hours. No results for input(s): AMMONIA in the last 168 hours. Coagulation Profile: Recent Labs  Lab 01/29/21 0949  INR 1.0   Cardiac Enzymes: No results for input(s): CKTOTAL, CKMB, CKMBINDEX, TROPONINI in the last 168 hours. BNP (last 3 results) No results for input(s): PROBNP in the last 8760 hours. HbA1C: No results for input(s): HGBA1C in the last 72 hours. CBG: No results for input(s): GLUCAP in the last 168 hours. Lipid Profile: No results for input(s): CHOL, HDL, LDLCALC, TRIG, CHOLHDL,  LDLDIRECT in the last 72 hours. Thyroid Function Tests: No results for input(s): TSH, T4TOTAL, FREET4, T3FREE, THYROIDAB in the last 72 hours. Anemia Panel: No results for input(s): VITAMINB12, FOLATE, FERRITIN, TIBC, IRON, RETICCTPCT in the last 72 hours. Urine analysis:    Component Value Date/Time   COLORURINE Yellow 11/05/2011 1127   APPEARANCEUR Cloudy 11/05/2011 1127   LABSPEC 1.027 11/05/2011 1127   PHURINE 6.0 11/05/2011 1127   GLUCOSEU Negative 11/05/2011 1127   HGBUR 1+ 11/05/2011 1127   BILIRUBINUR Negative 11/05/2011 1127   KETONESUR 1+ 11/05/2011 1127   PROTEINUR 30 mg/dL 11/05/2011 1127   NITRITE Negative 11/05/2011 1127   LEUKOCYTESUR 1+ 11/05/2011 1127    Radiological Exams on Admission: DG Chest 2 View  Result Date: 01/29/2021 CLINICAL DATA:  Pt to ED [facility] from home for cp that started this am. Took ASA and nitro Pt has had 2 NSTEMI this month. cp EXAM: CHEST - 2 VIEW COMPARISON:  01/05/2021, CT FINDINGS: Normal cardiac silhouette. Chronic elevation of the LEFT hemidiaphragm. Opacity on lateral projection associated with gastric pull up at the RIGHT lung base. There is atelectasis at the LEFT lung base similar prior. Upper lungs clear. IMPRESSION: 1. No interval change. 2. Elevation LEFT hemidiaphragm with LEFT basilar atelectasis. 3. Gastric pull up noted at the RIGHT lung base. Electronically Signed   By: Suzy Bouchard M.D.   On: 01/29/2021 10:35    EKG: Independently reviewed. As noted above  Assessment/Plan  ACS in background of recent NSTEMI due to SCAD -admit to progressive care  - continue asa, statin , metoprolol, increase imdur as tolerated  -aim for bp < 120/80 -prn nitro/morphine for chest pain  -recent imaging and cath 2 weeks ago , cardiology to decide on further imaging -continue to trend CE, current delta 61  -f/u on final cardiology recs   Hypothyroidism -continue synthroid  OSA -not on cpap currently was non-complaint  Now undergoing  outpatient re-eval for cpap unit  -  monitor pulse ox qhs, qhs O2   HTN -continue home regimen  Hx of Esophageal cancer -s/p esophogectomy 2006  -no active issues   DVT prophylaxis: lovenox  Code Status: full Family Communication: n/a none at bedside Disposition Plan: patient  expected to be admitted greater than 2 midnights  Consults called: Cardiology [place] Admission status: obs   Clance Boll MD [facility]  If 7PM-7AM, please contact night-coverage [email or web address] Password [facility]  01/29/2021, 1:37 PM

## 2021-01-29 NOTE — ED Triage Notes (Signed)
Pt to ED [facility] from home for cp that started this am. Took ASA and nitro x1 PTA VSS  Pt has had 2 NSTEMI this month.

## 2021-01-30 ENCOUNTER — Encounter: Payer: Self-pay | Admitting: Internal Medicine

## 2021-01-30 DIAGNOSIS — I2542 Coronary artery dissection: Secondary | ICD-10-CM

## 2021-01-30 DIAGNOSIS — K219 Gastro-esophageal reflux disease without esophagitis: Secondary | ICD-10-CM | POA: Diagnosis not present

## 2021-01-30 DIAGNOSIS — I248 Other forms of acute ischemic heart disease: Secondary | ICD-10-CM | POA: Diagnosis not present

## 2021-01-30 DIAGNOSIS — R079 Chest pain, unspecified: Secondary | ICD-10-CM | POA: Diagnosis not present

## 2021-01-30 LAB — BASIC METABOLIC PANEL
Anion gap: 5 (ref 5–15)
BUN: 13 mg/dL (ref 6–20)
CO2: 30 mmol/L (ref 22–32)
Calcium: 8.7 mg/dL — ABNORMAL LOW (ref 8.9–10.3)
Chloride: 105 mmol/L (ref 98–111)
Creatinine, Ser: 1.1 mg/dL — ABNORMAL HIGH (ref 0.44–1.00)
GFR, Estimated: 58 mL/min — ABNORMAL LOW (ref >60)
Glucose, Bld: 96 mg/dL (ref 70–99)
Potassium: 3.5 mmol/L (ref 3.5–5.1)
Sodium: 140 mmol/L (ref 135–145)

## 2021-01-30 LAB — CBC
HCT: 41.9 % (ref 36.0–46.0)
Hemoglobin: 14 g/dL (ref 12.0–15.0)
MCH: 30.2 pg (ref 26.0–34.0)
MCHC: 33.4 g/dL (ref 30.0–36.0)
MCV: 90.3 fL (ref 80.0–100.0)
Platelets: 172 10*3/uL (ref 150–400)
RBC: 4.64 MIL/uL (ref 3.87–5.11)
RDW: 14.3 % (ref 11.5–15.5)
WBC: 6 10*3/uL (ref 4.0–10.5)
nRBC: 0 % (ref 0.0–0.2)

## 2021-01-30 LAB — TSH: TSH: 1.706 u[IU]/mL (ref 0.350–4.500)

## 2021-01-30 LAB — LIPID PANEL
Cholesterol: 101 mg/dL (ref 0–200)
HDL: 44 mg/dL (ref >40)
LDL Cholesterol: 43 mg/dL (ref 0–99)
Total CHOL/HDL Ratio: 2.3 RATIO
Triglycerides: 70 mg/dL (ref <150)
VLDL: 14 mg/dL (ref 0–40)

## 2021-01-30 LAB — TROPONIN I (HIGH SENSITIVITY): Troponin I (High Sensitivity): 137 ng/L (ref <18)

## 2021-01-30 MED ORDER — ISOSORBIDE MONONITRATE ER 60 MG PO TB24
60.0000 mg | ORAL_TABLET | Freq: Two times a day (BID) | ORAL | Status: DC
  Administered 2021-01-30: 60 mg via ORAL
  Filled 2021-01-30: qty 1

## 2021-01-30 MED ORDER — ISOSORBIDE MONONITRATE ER 60 MG PO TB24: 60.0000 mg | ORAL_TABLET | Freq: Two times a day (BID) | ORAL | 0 refills | Status: DC

## 2021-01-30 MED ORDER — POTASSIUM CHLORIDE CRYS ER 20 MEQ PO TBCR
40.0000 meq | EXTENDED_RELEASE_TABLET | Freq: Once | ORAL | Status: AC
  Administered 2021-01-30: 40 meq via ORAL
  Filled 2021-01-30: qty 2

## 2021-01-30 NOTE — Progress Notes (Addendum)
Progress Note  Patient Name: Lorraine Jimenez Date of Encounter: 01/30/2021  Primary Cardiologist: Kate Sable, MD   Subjective   Denies any current chest pain, including when ambulating around in the room.  No shortness of breath.  Reviewed previous echo and cath results.  Inpatient Medications    Scheduled Meds:  aspirin EC  81 mg Oral Daily   atorvastatin  40 mg Oral Daily   calcium citrate-vitamin D  1 tablet Oral Q1500   cholecalciferol  1,000 Units Oral Daily   enoxaparin (LOVENOX) injection  0.5 mg/kg Subcutaneous Q24H   isosorbide mononitrate  120 mg Oral Daily   levothyroxine  50 mcg Oral Q0600   metoprolol succinate  25 mg Oral Daily   Continuous Infusions:  PRN Meds: acetaminophen, albuterol, nitroGLYCERIN, ondansetron (ZOFRAN) IV   Vital Signs    Vitals:   01/30/21 0230 01/30/21 0555 01/30/21 0700 01/30/21 0730  BP: (!) 102/55 (!) 142/68 125/77 127/83  Pulse: (!) 52 66 (!) 57 (!) 55  Resp: '16 16 12 15  '$ Temp:  98.3 F (36.8 C)    TempSrc:  Oral    SpO2: 94% 97% 95% 97%  Weight:      Height:       No intake or output data in the 24 hours ending 01/30/21 0932 Last 3 Weights 01/29/2021 01/27/2021 01/06/2021  Weight (lbs) 196 lb 194 lb 6 oz 197 lb 14.4 oz  Weight (kg) 88.905 kg 88.168 kg 89.767 kg      Telemetry    NSR - Personally Reviewed  ECG    NSR, poor R wave progression III, aVF, previous anterior infarct with poor R wave progression V1 through V3 - Personally Reviewed  Physical Exam   GEN: No acute distress.  Joined by her husband. Neck: No JVD Cardiac: RRR, 1/6 systolic murmur.  No rubs or gallops.  Respiratory: Clear to auscultation bilaterally. GI: Soft, nontender, non-distended  MS: No edema; No deformity. Neuro:  Nonfocal  Psych: Normal affect   Labs    High Sensitivity Troponin:   Recent Labs  Lab 01/08/21 0617 01/29/21 0949 01/29/21 1148 01/29/21 1811 01/30/21 0246  TROPONINIHS 4,142* 34* 95* 194* 137*       Chemistry Recent Labs  Lab 01/29/21 0949 01/29/21 1811 01/30/21 0529  NA 138  --  140  K 3.6  --  3.5  CL 105  --  105  CO2 24  --  30  GLUCOSE 128*  --  96  BUN 10  --  13  CREATININE 0.83 0.93 1.10*  CALCIUM 8.7*  --  8.7*  GFRNONAA >60 >60 58*  ANIONGAP 9  --  5     Hematology Recent Labs  Lab 01/29/21 0949 01/29/21 1811 01/30/21 0529  WBC 6.4 7.1 6.0  RBC 4.73 4.70 4.64  HGB 14.1 14.0 14.0  HCT 41.9 41.8 41.9  MCV 88.6 88.9 90.3  MCH 29.8 29.8 30.2  MCHC 33.7 33.5 33.4  RDW 13.9 13.9 14.3  PLT 193 193 172    BNPNo results for input(s): BNP, PROBNP in the last 168 hours.   DDimer No results for input(s): DDIMER in the last 168 hours.   Radiology    DG Chest 2 View  Result Date: 01/29/2021 CLINICAL DATA:  Pt to ED [facility] from home for cp that started this am. Took ASA and nitro Pt has had 2 NSTEMI this month. cp EXAM: CHEST - 2 VIEW COMPARISON:  01/05/2021, CT FINDINGS: Normal cardiac silhouette.  Chronic elevation of the LEFT hemidiaphragm. Opacity on lateral projection associated with gastric pull up at the RIGHT lung base. There is atelectasis at the LEFT lung base similar prior. Upper lungs clear. IMPRESSION: 1. No interval change. 2. Elevation LEFT hemidiaphragm with LEFT basilar atelectasis. 3. Gastric pull up noted at the RIGHT lung base. Electronically Signed   By: Suzy Bouchard M.D.   On: 01/29/2021 10:35    Cardiac Studies   LHC 01/06/21   Dist RCA lesion is 80% stenosed.   The left ventricular systolic function is normal.   LV end diastolic pressure is mildly elevated.   The left ventricular ejection fraction is greater than 65% by visual estimate.   There is no aortic valve stenosis. 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 RECOMMENDATIONS: Medical management of likely SCAD with at least 1 year of antiplatelet therapy.  If she has  recurrent chest discomfort or exertional chest discomfort in the future, we can consider repeat angiography in 6 weeks though this may not be necessary. Ambulatory referral to cardiac rehab placed.  Echo 01/05/21  1. Left ventricular ejection fraction, by estimation, is 55 to 60%. The  left ventricle has normal function. The left ventricle has no regional  wall motion abnormalities. Left ventricular diastolic parameters were  normal.   2. Right ventricular systolic function is normal. The right ventricular  size is normal. Tricuspid regurgitation signal is inadequate for assessing  PA pressure.   3. The mitral valve is normal in structure. Trivial mitral valve  regurgitation. No evidence of mitral stenosis.   4. The aortic valve is normal in structure. Aortic valve regurgitation is  not visualized. No aortic stenosis is present.   5. The inferior vena cava is normal in size with greater than 50%  respiratory variability, suggesting right atrial pressure of 3 mmHg.   Patient Profile     60 y.o. female with history of CAD s/p 8/8 cath with 80%s RCA and 80% stenosis of RCA appearing consistent with SCAD with recommendation for medical management with DAPT for 1 year and who is being seen today for the evaluation of recurrent chest pain at rest/her desk and elevated troponin.  Assessment & Plan     History of CAD/suspected SCAD --Reports an episode of recurrent chest pain when at her desk, relieved by 1 SL nitro, and which has not recurred since that time. Does not check BP at home but willing to start. Previous echo with normal LVEF, NR WMA, trivial MR.  01/06/21 LHC with mildly elevated LVEDP and 80%s RCA thought consistent with SCAD and with recommendation for ASA 1 year and cardiac rehab.  Also recommended was repeat LHC if recurrent angina. HS Tn peaked at 194, downtrending.  Initial EKG with TWI in inferior leads, resolved on today's EKG. Reports ambulation around the room today without any  exertional CP.  At this time, suspect supply demand ischemia in the setting of elevated BP at presentation. --Continue ASA x1 year, Imdur as BP allows, Toprol 25 mg daily as HR /BP allows, Lipitor, as needed SL nitro. --Daily BMET.  Replete potassium as most recent K 3.5 with goal 4.0.  --Check TSH. --Given she is currently asymptomatic and has ambulated without chest pain, she can likely be discharged home with follow-up in the office.  Will discuss with MD. Update: Per MD will start heart healthy diet.  -- Recommend home BP monitoring as  below, daily weights. --Recommend OP cardiac rehab.  She has not yet attended cardiac rehab as reports she is unable to make the 3x/wk for cardiac rehab due to PT for her shoulder 2x /wk.  We discussed looking into at least going to cardiac rehab 1-2x/wk to start.    Essential hypertension --Continue Toprol, Imdur.  Discussed monitoring her BP at home.  She does not have a battery for home BP cuff but will get one, so that she can start monitoring home BP.  NSVT --Continue beta-blocker as heart rate/BP allows.  Hyperlipidemia - LDL 43 and at goal of below 70.  Continue statin.  Possible OSA --Consider outpatient watch Pat versus pulmonary medicine referral.    For questions or updates, please contact [place] Please consult [email or web address] for contact info under        Signed, Arvil Chaco, PA-C  01/30/2021, 9:32 AM

## 2021-01-30 NOTE — ED Notes (Signed)
Order noted for discharge.  IV left antecubital discontinued, catheter tip intact site without redness or drainage.  AVS printed and gone over with patient and husband.

## 2021-01-30 NOTE — ED Notes (Signed)
Pt ambulatory to restroom, NAD noted.  

## 2021-01-30 NOTE — ED Notes (Signed)
Repeat troponin sent

## 2021-01-30 NOTE — Discharge Summary (Signed)
Physician Discharge Summary  Lorraine Jimenez [id number] DOB: 07/26/1960 DOA: 01/29/2021  PCP: Earlie Counts, FNP  Admit date: 01/29/2021 Discharge date: 01/30/2021  Discharge disposition: Home   Recommendations for Outpatient Follow-Up:   Follow-up with PCP in 1 to 2 weeks. Follow-up with cardiologist within 1 month of discharge   Discharge Diagnosis:   Active Problems:   Primary hypertension   GERD (gastroesophageal reflux disease)   Chest pain   Demand ischemia ([place])   Spontaneous dissection of coronary artery    Discharge Condition: Stable.  Diet recommendation:  Diet Order             Diet Heart Room service appropriate? Yes; Fluid consistency: Thin  Diet effective now           Diet - low sodium heart healthy                     Code Status: Full Code     Hospital Course:   Ms. Lorraine Jimenez is a 59 y.o. female with medical history significant of hypertension, hypothyroidism, OSA,  hx of CAD, single-vessel coronary artery disease in distal RCA (80%) consistent with spontaneous coronary artery dissection found on left heart cath on 01/06/2021 (when she was hospitalized for NSTEMI).  She presented to the hospital again on 01/29/2021 because of chest pain.  Troponins were mildly elevated with maximum troponin of 194.  She was admitted to the hospital for observation.  Cardiologist was consulted.  Findings were not consistent with acute NSTEMI/ACS.  Additional work-up was deferred.  Imdur was increased from 60 mg daily to 120 mg twice daily.  Her chest pain has resolved.  She was able to ambulate without any symptoms.  She is deemed stable for discharge to home today.  Outpatient sleep study was strongly recommended for evaluation of obstructive sleep apnea.  Discharge plan was discussed with her husband at the bedside.  Case was discussed with cardiologist, Dr. Rockey Situ.  Medical Consultants:   Cardiologist   Discharge Exam:    Vitals:   01/30/21  0730 01/30/21 1039 01/30/21 1100 01/30/21 1430  BP: 127/83 (!) 155/63 (!) 141/70 (!) 117/59  Pulse: (!) 55 (!) 58 (!) 53 60  Resp: '15 16 19 19  '$ Temp:      TempSrc:      SpO2: 97% 96% 95% 95%  Weight:      Height:         GEN: NAD SKIN: Warm and dry EYES: EOMI ENT: MMM CV: RRR PULM: CTA B ABD: soft, obese, NT, +BS CNS: AAO x 3, non focal EXT: No edema or tenderness   The results of significant diagnostics from this hospitalization (including imaging, microbiology, ancillary and laboratory) are listed below for reference.     Procedures and Diagnostic Studies:   DG Chest 2 View  Result Date: 01/29/2021 CLINICAL DATA:  Pt to ED [facility] from home for cp that started this am. Took ASA and nitro Pt has had 2 NSTEMI this month. cp EXAM: CHEST - 2 VIEW COMPARISON:  01/05/2021, CT FINDINGS: Normal cardiac silhouette. Chronic elevation of the LEFT hemidiaphragm. Opacity on lateral projection associated with gastric pull up at the RIGHT lung base. There is atelectasis at the LEFT lung base similar prior. Upper lungs clear. IMPRESSION: 1. No interval change. 2. Elevation LEFT hemidiaphragm with LEFT basilar atelectasis. 3. Gastric pull up noted at the RIGHT lung base. Electronically Signed   By: Helane Gunther.D.  On: 01/29/2021 10:35     Labs:   Basic Metabolic Panel: Recent Labs  Lab 01/29/21 0949 01/29/21 1811 01/30/21 0529  NA 138  --  140  K 3.6  --  3.5  CL 105  --  105  CO2 24  --  30  GLUCOSE 128*  --  96  BUN 10  --  13  CREATININE 0.83 0.93 1.10*  CALCIUM 8.7*  --  8.7*   GFR Estimated Creatinine Clearance: 57 mL/min (A) (by C-G formula based on SCr of 1.1 mg/dL (H)). Liver Function Tests: No results for input(s): AST, ALT, ALKPHOS, BILITOT, PROT, ALBUMIN in the last 168 hours. No results for input(s): LIPASE, AMYLASE in the last 168 hours. No results for input(s): AMMONIA in the last 168 hours. Coagulation profile Recent Labs  Lab 01/29/21 0949  INR 1.0     CBC: Recent Labs  Lab 01/29/21 0949 01/29/21 1811 01/30/21 0529  WBC 6.4 7.1 6.0  HGB 14.1 14.0 14.0  HCT 41.9 41.8 41.9  MCV 88.6 88.9 90.3  PLT 193 193 172   Cardiac Enzymes: No results for input(s): CKTOTAL, CKMB, CKMBINDEX, TROPONINI in the last 168 hours. BNP: Invalid input(s): POCBNP CBG: No results for input(s): GLUCAP in the last 168 hours. D-Dimer No results for input(s): DDIMER in the last 72 hours. Hgb A1c No results for input(s): HGBA1C in the last 72 hours. Lipid Profile Recent Labs    01/30/21 0529  CHOL 101  HDL 44  LDLCALC 43  TRIG 70  CHOLHDL 2.3   Thyroid function studies Recent Labs    01/30/21 0529  TSH 1.706   Anemia work up No results for input(s): VITAMINB12, FOLATE, FERRITIN, TIBC, IRON, RETICCTPCT in the last 72 hours. Microbiology Recent Results (from the past 240 hour(s))  Resp Panel by RT-PCR (Flu A&B, Covid) Nasopharyngeal Swab     Status: None   Collection Time: 01/29/21  9:08 PM   Specimen: Nasopharyngeal Swab; Nasopharyngeal(NP) swabs in vial transport medium  Result Value Ref Range Status   SARS Coronavirus 2 by RT PCR NEGATIVE NEGATIVE Final    Comment: (NOTE) SARS-CoV-2 target nucleic acids are NOT DETECTED.  The SARS-CoV-2 RNA is generally detectable in upper respiratory specimens during the acute phase of infection. The lowest concentration of SARS-CoV-2 viral copies this assay can detect is 138 copies/mL. A negative result does not preclude SARS-Cov-2 infection and should not be used as the sole basis for treatment or other patient management decisions. A negative result may occur with  improper specimen collection/handling, submission of specimen other than nasopharyngeal swab, presence of viral mutation(s) within the areas targeted by this assay, and inadequate number of viral copies(<138 copies/mL). A negative result must be combined with clinical observations, patient history, and epidemiological information.  The expected result is Negative.  Fact Sheet for Patients:  EntrepreneurPulse.com.au  Fact Sheet for Healthcare Providers:  IncredibleEmployment.be  This test is no t yet approved or cleared by the [place] FDA and  has been authorized for detection and/or diagnosis of SARS-CoV-2 by FDA under an Emergency Use Authorization (EUA). This EUA will remain  in effect (meaning this test can be used) for the duration of the COVID-19 declaration under Section 564(b)(1) of the Act, 21 U.S.C.section 360bbb-3(b)(1), unless the authorization is terminated  or revoked sooner.       Influenza A by PCR NEGATIVE NEGATIVE Final   Influenza B by PCR NEGATIVE NEGATIVE Final    Comment: (NOTE) The Xpert Xpress SARS-CoV-2/FLU/RSV  plus assay is intended as an aid in the diagnosis of influenza from Nasopharyngeal swab specimens and should not be used as a sole basis for treatment. Nasal washings and aspirates are unacceptable for Xpert Xpress SARS-CoV-2/FLU/RSV testing.  Fact Sheet for Patients: EntrepreneurPulse.com.au  Fact Sheet for Healthcare Providers: IncredibleEmployment.be  This test is not yet approved or cleared by the [place] FDA and has been authorized for detection and/or diagnosis of SARS-CoV-2 by FDA under an Emergency Use Authorization (EUA). This EUA will remain in effect (meaning this test can be used) for the duration of the COVID-19 declaration under Section 564(b)(1) of the Act, 21 U.S.C. section 360bbb-3(b)(1), unless the authorization is terminated or revoked.  Performed at [facility], [address], [place], [place] [address]      Discharge Instructions:   Discharge Instructions     Diet - low sodium heart healthy   Complete by: As directed    Increase activity slowly   Complete by: As directed       Allergies as of 01/30/2021   No Known Allergies      Medication  List     TAKE these medications    albuterol 108 (90 Base) MCG/ACT inhaler Commonly known as: VENTOLIN HFA Inhale 2 puffs into the lungs every 6 (six) hours as needed for wheezing or shortness of breath.   aspirin 81 MG EC tablet Take 1 tablet (81 mg total) by mouth daily. Swallow whole.   atorvastatin 40 MG tablet Commonly known as: LIPITOR Take 1 tablet (40 mg total) by mouth daily.   azelastine 0.1 % nasal spray Commonly known as: ASTELIN Place 2 sprays into both nostrils 2 (two) times daily. Use in each nostril as directed   CALCIUM CITRATE + PO Take by mouth daily in the afternoon.   fluticasone 50 MCG/ACT nasal spray Commonly known as: FLONASE Place into both nostrils daily.   isosorbide mononitrate 60 MG 24 hr tablet Commonly known as: IMDUR Take 1 tablet (60 mg total) by mouth 2 (two) times daily. What changed: when to take this   levothyroxine 50 MCG tablet Commonly known as: SYNTHROID Take 50 mcg by mouth daily before breakfast.   metoprolol succinate 25 MG 24 hr tablet Commonly known as: TOPROL-XL Take 1 tablet (25 mg total) by mouth daily.   nitroGLYCERIN 0.4 MG SL tablet Commonly known as: NITROSTAT Place 1 tablet (0.4 mg total) under the tongue every 5 (five) minutes x 3 doses as needed for up to 10 doses for chest pain.   VITAMIN D PO Take by mouth daily.        Follow-up Information     Kate Sable, MD. Schedule an appointment as soon as possible for a visit in 1 month(s).   Specialties: Cardiology, Radiology Contact information: [place] [phone]                  Time coordinating discharge: 29 minutes  Signed:  Jennye Boroughs  [facility] 01/30/2021, 3:21 PM   Pager on [email or web address]. If 7PM-7AM, please contact night-coverage at [email or web address]

## 2021-01-30 NOTE — ED Notes (Signed)
Lab called and informed of TSH lab add-on.  Lab verbalized understanding.

## 2021-02-26 ENCOUNTER — Other Ambulatory Visit: Payer: Self-pay | Admitting: Cardiology

## 2021-02-26 NOTE — Telephone Encounter (Signed)
*  STAT* If patient is at the pharmacy, call can be transferred to refill team.   1. Which medications need to be refilled? (please list name of each medication and dose if known) Isosorbide 60mg  1 tablet twice a day  2. Which pharmacy/location (including street and city if local pharmacy) is medication to be sent to? [facility]  3. Do they need a 30 day or 90 day supply? 90 day

## 2021-02-26 NOTE — Telephone Encounter (Signed)
Requested Prescriptions   Pending Prescriptions Disp Refills   isosorbide mononitrate (IMDUR) 60 MG 24 hr tablet 180 tablet 0    Sig: Take 1 tablet (60 mg total) by mouth 2 (two) times daily.

## 2021-02-27 NOTE — Telephone Encounter (Signed)
Patient calling for status not received by [facility] yet

## 2021-02-28 MED ORDER — ISOSORBIDE MONONITRATE ER 60 MG PO TB24: 60.0000 mg | ORAL_TABLET | Freq: Two times a day (BID) | ORAL | 0 refills | Status: DC

## 2021-03-04 ENCOUNTER — Telehealth: Payer: Self-pay

## 2021-03-04 NOTE — Telephone Encounter (Signed)
Called and spoke to patient patient has had a sleep study x2. Patient states she has been diagnosed with OSA. Has tried cpap x2 but could not get the hang of it. She state due to her heart issues she really needs to use a CPAP. Is interested in other options.

## 2021-03-05 ENCOUNTER — Encounter: Payer: Self-pay | Admitting: Internal Medicine

## 2021-03-05 ENCOUNTER — Ambulatory Visit (INDEPENDENT_AMBULATORY_CARE_PROVIDER_SITE_OTHER): Payer: BC Managed Care – PPO | Admitting: Internal Medicine

## 2021-03-05 ENCOUNTER — Other Ambulatory Visit: Payer: Self-pay

## 2021-03-05 VITALS — BP 180/110 | HR 65 | Temp 97.1°F | Ht 62.0 in | Wt 201.0 lb

## 2021-03-05 DIAGNOSIS — G4733 Obstructive sleep apnea (adult) (pediatric): Secondary | ICD-10-CM

## 2021-03-05 DIAGNOSIS — G4719 Other hypersomnia: Secondary | ICD-10-CM | POA: Diagnosis not present

## 2021-03-05 NOTE — Progress Notes (Signed)
Name: Lorraine Jimenez MRN: [id number] DOB: Jan 15, 1961     CONSULTATION DATE:  REFERRING MD : 03/05/2021   CHIEF COMPLAINT: excessive daytime sleepiness and high blood pressurs   HISTORY OF PRESENT ILLNESS:  Patient is seen today for problems and issues with sleep related to excessive daytime sleepiness Patient  has been having sleep problems for many years Patient has been having excessive daytime sleepiness for a long time Patient has been having extreme fatigue and tiredness, lack of energy +  very Loud snoring every night + struggling breathe at night and gasps for air   Discussed sleep data and reviewed with patient.  Encouraged proper weight management.  Discussed driving precautions and its relationship with hypersomnolence.  Discussed operating dangerous equipment and its relationship with hypersomnolence.  Discussed sleep hygiene, and benefits of a fixed sleep waked time.  The importance of getting eight or more hours of sleep discussed with patient.  Discussed limiting the use of the computer and television before bedtime.  Decrease naps during the day, so night time sleep will become enhanced.  Limit caffeine, and sleep deprivation.  HTN, stroke, and heart failure are potential risk factors.    EPWORTH SLEEP SCORE 20  Patient with a previous diagnosis of obstructive sleep apnea in the past Diagnosed 5 to 10 years ago Patient has had several issues with masks Currently has hypertension Patient noted to have more heart problems  she wants to really try getting aggressive therapy for sleep apnea  PAST MEDICAL HISTORY :   has a past medical history of Asthma, Cancer ([place]), Chronic kidney disease, Eczema, Fatty liver, GERD (gastroesophageal reflux disease), Heart murmur, Hypertension, Hypothyroidism, MI (myocardial infarction) ([place]), Multinodular goiter, and Sleep apnea.  has a past surgical history that includes Cesarean section; Colonoscopy; Esophagectomy;  Exploratory laparotomy; Abdominal hysterectomy; Thyroid lobectomy; Tubal ligation; Upper gi endoscopy; Diagnostic laparoscopy; Colonoscopy with propofol (N/A, 03/15/2020); Esophagogastroduodenoscopy (egd) with propofol (N/A, 03/15/2020); and LEFT HEART CATH AND CORONARY ANGIOGRAPHY (N/A, 01/06/2021). Prior to Admission medications   Medication Sig Start Date End Date Taking? Authorizing Provider  albuterol (VENTOLIN HFA) 108 (90 Base) MCG/ACT inhaler Inhale 2 puffs into the lungs every 6 (six) hours as needed for wheezing or shortness of breath.    [provider]  aspirin EC 81 MG EC tablet Take 1 tablet (81 mg total) by mouth daily. Swallow whole. 01/09/21 04/09/21  Terrilee Croak, MD  atorvastatin (LIPITOR) 40 MG tablet Take 1 tablet (40 mg total) by mouth daily. 01/09/21 04/09/21  Terrilee Croak, MD  azelastine (ASTELIN) 0.1 % nasal spray Place 2 sprays into both nostrils 2 (two) times daily. Use in each nostril as directed    [provider]  Calcium Citrate-Vitamin D (CALCIUM CITRATE + PO) Take by mouth daily in the afternoon.    [provider]  fluticasone (FLONASE) 50 MCG/ACT nasal spray Place into both nostrils daily.    [provider]  isosorbide mononitrate (IMDUR) 60 MG 24 hr tablet Take 1 tablet (60 mg total) by mouth 2 (two) times daily. 02/28/21 05/29/21  Kate Sable, MD  levothyroxine (SYNTHROID) 50 MCG tablet Take 50 mcg by mouth daily before breakfast.    [provider]  metoprolol succinate (TOPROL-XL) 25 MG 24 hr tablet Take 1 tablet (25 mg total) by mouth daily. 01/09/21 04/09/21  Terrilee Croak, MD  nitroGLYCERIN (NITROSTAT) 0.4 MG SL tablet Place 1 tablet (0.4 mg total) under the tongue every 5 (five) minutes x 3 doses as needed for up  to 10 doses for chest pain. 01/08/21   Terrilee Croak, MD  VITAMIN D PO Take by mouth daily.    [provider]   No Known Allergies  FAMILY HISTORY:  family history includes COPD in her mother;  Coronary artery disease in her father; Diabetes Mellitus II in her mother; Heart attack in her father; Heart disease in her father; Hypertension in her mother and sister; Hypothyroidism in her mother; Leukemia in her father; Thyroid disease in her mother and sister. SOCIAL HISTORY:  reports that she has never smoked. She has never used smokeless tobacco. She reports current alcohol use of about 1.0 standard drink per week. She reports that she does not use drugs.   Review of Systems:  Gen:  Denies  fever, sweats, chills weight loss  HEENT: Denies blurred vision, double vision, ear pain, eye pain, hearing loss, nose bleeds, sore throat Cardiac:  No dizziness, chest pain or heaviness, chest tightness,edema, No JVD Resp:   No cough, -sputum production, -shortness of breath,-wheezing, -hemoptysis,  Gi: Denies swallowing difficulty, stomach pain, nausea or vomiting, diarrhea, constipation, bowel incontinence Gu:  Denies bladder incontinence, burning urine Ext:   Denies Joint pain, stiffness or swelling Skin: Denies  skin rash, easy bruising or bleeding or hives Endoc:  Denies polyuria, polydipsia , polyphagia or weight change Psych:   Denies depression, insomnia or hallucinations  Other:  All other systems negative   ALL OTHER ROS ARE NEGATIVE   BP (!) 180/110 (BP Location: Left Arm, Patient Position: Sitting, Cuff Size: Large)   Pulse 65   Temp (!) 97.1 F (36.2 C)   Ht 5\' 2"  (1.575 m)   Wt 201 lb (91.2 kg)   SpO2 96%   BMI 36.76 kg/m     Physical Examination:   General Appearance: No distress  EYES PERRLA, EOM intact.   NECK Supple, No JVD Pulmonary: normal breath sounds, No wheezing.  CardiovascularNormal S1,S2.  No m/r/g.   Abdomen: Benign, Soft, non-tender. Skin:   warm, no rashes, no ecchymosis  Extremities: normal, no cyanosis, clubbing. Neuro:without focal findings,  speech normal  PSYCHIATRIC: Mood, affect within normal limits.   ALL OTHER ROS ARE  NEGATIVE         ASSESSMENT AND PLAN SYNOPSIS  60 year old pleasant white female with obesity with signs symptoms of sleep apnea with a previous diagnosis of sleep apnea Based on her situation with high blood pressure I would strongly encouraged to get a split-night sleep study for diagnosis and therapeutic intervention  Obesity -recommend significant weight loss -recommend changing diet  Deconditioned state -Recommend increased daily activity and exercise   MEDICATION ADJUSTMENTS/LABS AND TESTS ORDERED: Split-night sleep study needed   CURRENT MEDICATIONS REVIEWED AT LENGTH WITH PATIENT TODAY   Patient  satisfied with Plan of action and management. All questions answered  Follow up 6 months  Total Time Spent 35 mins   Maretta Bees Patricia Pesa, M.D.  [facility]  Medical Director [place] Director [facility] Cardio-Pulmonary Department

## 2021-03-05 NOTE — Patient Instructions (Signed)
Split-night sleep study needed

## 2021-03-07 ENCOUNTER — Encounter: Payer: Self-pay | Admitting: Cardiology

## 2021-03-07 ENCOUNTER — Ambulatory Visit (INDEPENDENT_AMBULATORY_CARE_PROVIDER_SITE_OTHER): Payer: BC Managed Care – PPO | Admitting: Cardiology

## 2021-03-07 ENCOUNTER — Other Ambulatory Visit: Payer: Self-pay

## 2021-03-07 VITALS — BP 148/100 | HR 66 | Ht 62.0 in | Wt 199.0 lb

## 2021-03-07 DIAGNOSIS — Z6836 Body mass index (BMI) 36.0-36.9, adult: Secondary | ICD-10-CM

## 2021-03-07 DIAGNOSIS — I2510 Atherosclerotic heart disease of native coronary artery without angina pectoris: Secondary | ICD-10-CM | POA: Diagnosis not present

## 2021-03-07 DIAGNOSIS — I10 Essential (primary) hypertension: Secondary | ICD-10-CM

## 2021-03-07 MED ORDER — AMLODIPINE BESYLATE 5 MG PO TABS: 5.0000 mg | ORAL_TABLET | Freq: Every day | ORAL | 1 refills | Status: DC

## 2021-03-07 MED ORDER — ISOSORBIDE MONONITRATE ER 120 MG PO TB24
120.0000 mg | ORAL_TABLET | Freq: Every day | ORAL | 1 refills | Status: DC
Start: 2021-03-07 — End: 2021-08-25

## 2021-03-07 NOTE — Progress Notes (Signed)
Cardiology Office Note:    Date:  03/07/2021   ID:  Lorraine Jimenez, DOB 02-04-1961, MRN [id number]  PCP:  Earlie Counts, [place] Providers Cardiologist:  Kate Sable, MD     Referring MD: Earlie Counts, FNP   Chief Complaint  Patient presents with   OTher    Hospital follow up -- Chest pain. Meds reviewed verbally with patient.     History of Present Illness:    Lorraine Jimenez is a 60 y.o. 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  Admitted last month to the hospital due to chest pain improved with sublingual nitro.  Imdur was increased to 120 mg daily for antianginal benefit.  She states having occasional chest pain requiring sublingual nitro at home.  She checks her blood pressure frequently at home and has been elevated anywhere from 150s to 180s.  Takes all medications as prescribed.  Prior notes Echo 01/05/2021 showed EF 55 to 60%. Left heart cath 01/06/2021 distal RCA 80%, consistent with SCAD.  Past Medical History:  Diagnosis Date   Asthma    Cancer ([place])    esophageal   Chronic kidney disease    CKD - Stage 3   Eczema    Fatty liver    GERD (gastroesophageal reflux disease)    Heart murmur    Hypertension    Hypothyroidism    MI (myocardial infarction) ([place])    Multinodular goiter    Sleep apnea     Past Surgical History:  Procedure Laterality Date   ABDOMINAL HYSTERECTOMY     CESAREAN SECTION     COLONOSCOPY     COLONOSCOPY WITH PROPOFOL N/A 03/15/2020   Procedure: COLONOSCOPY WITH PROPOFOL;  Surgeon: Lorraine Rubenstein, MD;  Location: [facility] ENDOSCOPY;  Service: Endoscopy;  Laterality: N/A;   DIAGNOSTIC LAPAROSCOPY     ESOPHAGECTOMY     ESOPHAGOGASTRODUODENOSCOPY (EGD) WITH PROPOFOL N/A 03/15/2020   Procedure: ESOPHAGOGASTRODUODENOSCOPY (EGD) WITH PROPOFOL;  Surgeon: Lorraine Rubenstein, MD;  Location: [facility] ENDOSCOPY;  Service: Endoscopy;  Laterality: N/A;   EXPLORATORY LAPAROTOMY      LEFT HEART CATH AND CORONARY ANGIOGRAPHY N/A 01/06/2021   Procedure: LEFT HEART CATH AND CORONARY ANGIOGRAPHY;  Surgeon: Lorraine Grime, MD;  Location: [facility];  Service: Cardiovascular;  Laterality: N/A;   THYROID LOBECTOMY     TUBAL LIGATION     UPPER GI ENDOSCOPY      Current Medications: Current Meds  Medication Sig   albuterol (VENTOLIN HFA) 108 (90 Base) MCG/ACT inhaler Inhale 2 puffs into the lungs every 6 (six) hours as needed for wheezing or shortness of breath.   amLODipine (NORVASC) 5 MG tablet Take 1 tablet (5 mg total) by mouth daily.   aspirin EC 81 MG EC tablet Take 1 tablet (81 mg total) by mouth daily. Swallow whole.   atorvastatin (LIPITOR) 40 MG tablet Take 1 tablet (40 mg total) by mouth daily.   azelastine (ASTELIN) 0.1 % nasal spray Place 2 sprays into both nostrils 2 (two) times daily. Use in each nostril as directed   Calcium Citrate-Vitamin D (CALCIUM CITRATE + PO) Take by mouth daily in the afternoon.   fluticasone (FLONASE) 50 MCG/ACT nasal spray Place into both nostrils daily.   levothyroxine (SYNTHROID) 50 MCG tablet Take 50 mcg by mouth daily before breakfast.   metoprolol succinate (TOPROL-XL) 25 MG 24 hr tablet Take 1 tablet (25 mg total) by  mouth daily.   nitroGLYCERIN (NITROSTAT) 0.4 MG SL tablet Place 1 tablet (0.4 mg total) under the tongue every 5 (five) minutes x 3 doses as needed for up to 10 doses for chest pain.   VITAMIN D PO Take by mouth daily.   [DISCONTINUED] isosorbide mononitrate (IMDUR) 60 MG 24 hr tablet Take 1 tablet (60 mg total) by mouth 2 (two) times daily.     Allergies:   Patient has no known allergies.   Social History   Socioeconomic History   Marital status: Married    Spouse name: Not on file   Number of children: Not on file   Years of education: Not on file   Highest education level: Not on file  Occupational History   Not on file  Tobacco Use   Smoking status: Never   Smokeless tobacco: Never   Vaping Use   Vaping Use: Never used  Substance and Sexual Activity   Alcohol use: Yes    Alcohol/week: 1.0 standard drink    Types: 1 Standard drinks or equivalent per week   Drug use: Never   Sexual activity: Not on file  Other Topics Concern   Not on file  Social History Narrative   Not on file   Social Determinants of Health   Financial Resource Strain: Not on file  Food Insecurity: Not on file  Transportation Needs: Not on file  Physical Activity: Not on file  Stress: Not on file  Social Connections: Not on file     Family History: The patient's family history includes COPD in her mother; Coronary artery disease in her father; Diabetes Mellitus II in her mother; Heart attack in her father; Heart disease in her father; Hypertension in her mother and sister; Hypothyroidism in her mother; Leukemia in her father; Thyroid disease in her mother and sister. There is no history of Breast cancer.  ROS:   Please see the history of present illness.     All other systems reviewed and are negative.  EKGs/Labs/Other Studies Reviewed:    The following studies were reviewed today:   EKG:  EKG is  ordered today.  The ekg ordered today demonstrates normal sinus rhythm, normal ECG.  Recent Labs: 01/05/2021: Magnesium 2.2 01/30/2021: BUN 13; Creatinine, Ser 1.10; Hemoglobin 14.0; Platelets 172; Potassium 3.5; Sodium 140; TSH 1.706  Recent Lipid Panel    Component Value Date/Time   CHOL 101 01/30/2021 0529   TRIG 70 01/30/2021 0529   HDL 44 01/30/2021 0529   CHOLHDL 2.3 01/30/2021 0529   VLDL 14 01/30/2021 0529   LDLCALC 43 01/30/2021 0529     Risk Assessment/Calculations:          Physical Exam:    VS:  BP (!) 148/100 (BP Location: Left Arm, Patient Position: Sitting, Cuff Size: Normal)   Pulse 66   Ht 5\' 2"  (1.575 m)   Wt 199 lb (90.3 kg)   SpO2 98%   BMI 36.40 kg/m     Wt Readings from Last 3 Encounters:  03/07/21 199 lb (90.3 kg)  03/05/21 201 lb (91.2 kg)   01/29/21 196 lb (88.9 kg)     GEN:  Well nourished, well developed in no acute distress HEENT: Normal NECK: No JVD; No carotid bruits LYMPHATICS: No lymphadenopathy CARDIAC: RRR, no murmurs, rubs, gallops RESPIRATORY:  Clear to auscultation without rales, wheezing or rhonchi  ABDOMEN: Soft, non-tender, non-distended MUSCULOSKELETAL:  No edema; No deformity  SKIN: Warm and dry NEUROLOGIC:  Alert and oriented x 3  PSYCHIATRIC:  Normal affect   ASSESSMENT:    1. Coronary artery disease involving native coronary artery of native heart without angina pectoris   2. Primary hypertension   3. 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  Occasional chest pain associated with hypertension.  Continue aspirin, Lipitor, Toprol-XL, Imdur 120 mg daily.  Start amlodipine 5 mg daily for BP benefit.  Exercises up to 50 to 70% maximum predicted heart rate to limit arterial shear stress.   Hypertension, BP elevated.  Start amlodipine 5 mg daily, continue Toprol-XL, Imdur 120 mg daily. Obesity, low-calorie diet, weight loss advised.  Follow-up in 6 months with myself.  Has an appointment with PA in 2 months.  Check BP, titrate amlodipine versus using Coreg if BP not well controlled.    Medication Adjustments/Labs and Tests Ordered: Current medicines are reviewed at length with the patient today.  Concerns regarding medicines are outlined above.  Orders Placed This Encounter  Procedures   EKG 12-Lead     Meds ordered this encounter  Medications   isosorbide mononitrate (IMDUR) 120 MG 24 hr tablet    Sig: Take 1 tablet (120 mg total) by mouth daily.    Dispense:  90 tablet    Refill:  1   amLODipine (NORVASC) 5 MG tablet    Sig: Take 1 tablet (5 mg total) by mouth daily.    Dispense:  90 tablet    Refill:  1     Patient Instructions  Medication Instructions:   Your physician has recommended you make the  following change in your medication:   Take your IMDUR as 120 MG once a day.  2.   START taking Amlodipine (Norvasc) 5 MG once a day.  *If you need a refill on your cardiac medications before your next appointment, please call your pharmacy*   Lab Work:  None Ordered  If you have labs (blood work) drawn today and your tests are completely normal, you will receive your results only by: [place] (if you have MyChart) OR A paper copy in the mail If you have any lab test that is abnormal or we need to change your treatment, we will call you to review the results.   Testing/Procedures:  None ordered   Follow-Up: At [facility], you and your health needs are our priority.  As part of our continuing mission to provide you with exceptional heart care, we have created designated Provider Care Teams.  These Care Teams include your primary Cardiologist (physician) and Advanced Practice Providers (APPs -  Physician Assistants and Nurse Practitioners) who all work together to provide you with the care you need, when you need it.  We recommend signing up for the patient portal called "MyChart".  Sign up information is provided on this After Visit Summary.  MyChart is used to connect with patients for Virtual Visits (Telemedicine).  Patients are able to view lab/test results, encounter notes, upcoming appointments, etc.  Non-urgent messages can be sent to your provider as well.   To learn more about what you can do with MyChart, go to [email or web address].    Your next appointment:   6 month(s)  The format for your next appointment:   In Person  Provider:   Kate Sable, MD   Other Instructions    Signed, Kate Sable, MD  03/07/2021 1:03 PM    [place]

## 2021-03-07 NOTE — Addendum Note (Signed)
Addended by: Carlisle Cater on: 03/07/2021 01:48 PM   Modules accepted: Orders

## 2021-03-07 NOTE — Patient Instructions (Signed)
Medication Instructions:   Your physician has recommended you make the following change in your medication:   Take your IMDUR as 120 MG once a day.  2.   START taking Amlodipine (Norvasc) 5 MG once a day.  *If you need a refill on your cardiac medications before your next appointment, please call your pharmacy*   Lab Work:  None Ordered  If you have labs (blood work) drawn today and your tests are completely normal, you will receive your results only by: [place] (if you have MyChart) OR A paper copy in the mail If you have any lab test that is abnormal or we need to change your treatment, we will call you to review the results.   Testing/Procedures:  None ordered   Follow-Up: At [facility], you and your health needs are our priority.  As part of our continuing mission to provide you with exceptional heart care, we have created designated Provider Care Teams.  These Care Teams include your primary Cardiologist (physician) and Advanced Practice Providers (APPs -  Physician Assistants and Nurse Practitioners) who all work together to provide you with the care you need, when you need it.  We recommend signing up for the patient portal called "MyChart".  Sign up information is provided on this After Visit Summary.  MyChart is used to connect with patients for Virtual Visits (Telemedicine).  Patients are able to view lab/test results, encounter notes, upcoming appointments, etc.  Non-urgent messages can be sent to your provider as well.   To learn more about what you can do with MyChart, go to [email or web address].    Your next appointment:   6 month(s)  The format for your next appointment:   In Person  Provider:   Kate Sable, MD   Other Instructions

## 2021-03-31 ENCOUNTER — Other Ambulatory Visit: Payer: Self-pay

## 2021-03-31 ENCOUNTER — Other Ambulatory Visit
Admission: RE | Admit: 2021-03-31 | Discharge: 2021-03-31 | Disposition: A | Discharge status: Home / Self Care | Payer: BC Managed Care – PPO | Source: Ambulatory Visit | Attending: Internal Medicine | Admitting: Internal Medicine

## 2021-03-31 DIAGNOSIS — Z01812 Encounter for preprocedural laboratory examination: Secondary | ICD-10-CM | POA: Insufficient documentation

## 2021-03-31 DIAGNOSIS — Z20822 Contact with and (suspected) exposure to covid-19: Secondary | ICD-10-CM | POA: Diagnosis not present

## 2021-04-01 ENCOUNTER — Ambulatory Visit: Payer: BC Managed Care – PPO | Attending: Pulmonary Disease

## 2021-04-01 DIAGNOSIS — G4733 Obstructive sleep apnea (adult) (pediatric): Secondary | ICD-10-CM | POA: Diagnosis not present

## 2021-04-01 LAB — SARS CORONAVIRUS 2 (TAT 6-24 HRS): SARS Coronavirus 2: NEGATIVE

## 2021-04-02 ENCOUNTER — Other Ambulatory Visit: Payer: Self-pay

## 2021-04-08 ENCOUNTER — Other Ambulatory Visit: Payer: Self-pay | Admitting: Cardiology

## 2021-04-08 ENCOUNTER — Telehealth (INDEPENDENT_AMBULATORY_CARE_PROVIDER_SITE_OTHER): Payer: BC Managed Care – PPO | Admitting: Internal Medicine

## 2021-04-08 DIAGNOSIS — G4733 Obstructive sleep apnea (adult) (pediatric): Secondary | ICD-10-CM

## 2021-04-08 MED ORDER — ATORVASTATIN CALCIUM 40 MG PO TABS
40.0000 mg | ORAL_TABLET | Freq: Every day | ORAL | 0 refills | Status: DC
Start: 2021-04-08 — End: 2021-07-03

## 2021-04-08 MED ORDER — METOPROLOL SUCCINATE ER 25 MG PO TB24: 25.0000 mg | ORAL_TABLET | Freq: Every day | ORAL | 0 refills | Status: DC

## 2021-04-08 MED ORDER — ASPIRIN 81 MG PO TBEC: 81.0000 mg | DELAYED_RELEASE_TABLET | Freq: Every day | ORAL | 0 refills | Status: DC

## 2021-04-08 NOTE — Telephone Encounter (Signed)
Requested Prescriptions   Pending Prescriptions Disp Refills   aspirin 81 MG EC tablet 90 tablet 0    Sig: Take 1 tablet (81 mg total) by mouth daily. Swallow whole.   Signed Prescriptions Disp Refills   atorvastatin (LIPITOR) 40 MG tablet 90 tablet 0    Sig: Take 1 tablet (40 mg total) by mouth daily.    Authorizing Provider: Kate Sable    Ordering User: Eugenio Hoes, Riana Tessmer C   metoprolol succinate (TOPROL-XL) 25 MG 24 hr tablet 90 tablet 0    Sig: Take 1 tablet (25 mg total) by mouth daily.    Authorizing Provider: Kate Sable    Ordering User: Britt Bottom

## 2021-04-08 NOTE — Telephone Encounter (Signed)
Severe OSA with lowest desat 60% BiPAP was required upto 23/17 cm Suggest auto BiPAP IPAP max 23, PS +6, EPAP min 10

## 2021-04-08 NOTE — Telephone Encounter (Signed)
Spoke to patient, who is requesting sleep study results.  It does not appear that sleep study has been read.  Spoke to [place] with [facility]. Sleep study has been read yet.

## 2021-04-08 NOTE — Telephone Encounter (Signed)
*  STAT* If patient is at the pharmacy, call can be transferred to refill team.   1. Which medications need to be refilled? (please list name of each medication and dose if known)   Atorvastatin 40 mg po q d  Metoprolol 25 mg po q d  ASA 81 mg po q d   2. Which pharmacy/location (including street and city if local pharmacy) is medication to be sent to? [facility] [address]  3. Do they need a 30 day or 90 day supply? [place]

## 2021-04-09 DIAGNOSIS — G4733 Obstructive sleep apnea (adult) (pediatric): Secondary | ICD-10-CM | POA: Diagnosis not present

## 2021-04-09 NOTE — Telephone Encounter (Signed)
Patient is aware of results and voiced her understanding.  Order placed for bipap.  Pending recall for 06/2021. Nothing further needed.

## 2021-04-23 ENCOUNTER — Ambulatory Visit: Payer: BC Managed Care – PPO | Admitting: Nurse Practitioner

## 2021-04-23 ENCOUNTER — Encounter: Payer: Self-pay | Admitting: Nurse Practitioner

## 2021-04-23 ENCOUNTER — Ambulatory Visit (INDEPENDENT_AMBULATORY_CARE_PROVIDER_SITE_OTHER): Payer: BC Managed Care – PPO | Admitting: Nurse Practitioner

## 2021-04-23 ENCOUNTER — Other Ambulatory Visit: Payer: Self-pay

## 2021-04-23 VITALS — BP 126/72 | HR 71 | Ht 62.0 in | Wt 200.4 lb

## 2021-04-23 DIAGNOSIS — I10 Essential (primary) hypertension: Secondary | ICD-10-CM | POA: Diagnosis not present

## 2021-04-23 DIAGNOSIS — I2510 Atherosclerotic heart disease of native coronary artery without angina pectoris: Secondary | ICD-10-CM

## 2021-04-23 DIAGNOSIS — I2542 Coronary artery dissection: Secondary | ICD-10-CM

## 2021-04-23 DIAGNOSIS — E785 Hyperlipidemia, unspecified: Secondary | ICD-10-CM | POA: Diagnosis not present

## 2021-04-23 DIAGNOSIS — G4733 Obstructive sleep apnea (adult) (pediatric): Secondary | ICD-10-CM

## 2021-04-23 NOTE — Patient Instructions (Signed)
Medication Instructions:  - Your physician recommends that you continue on your current medications as directed. Please refer to the Current Medication list given to you today.  *If you need a refill on your cardiac medications before your next appointment, please call your pharmacy*   Lab Work: - none ordered  If you have labs (blood work) drawn today and your tests are completely normal, you will receive your results only by: [place] (if you have MyChart) OR A paper copy in the mail If you have any lab test that is abnormal or we need to change your treatment, we will call you to review the results.   Testing/Procedures: - none ordered   Follow-Up: At [facility], you and your health needs are our priority.  As part of our continuing mission to provide you with exceptional heart care, we have created designated Provider Care Teams.  These Care Teams include your primary Cardiologist (physician) and Advanced Practice Providers (APPs -  Physician Assistants and Nurse Practitioners) who all work together to provide you with the care you need, when you need it.  We recommend signing up for the patient portal called "MyChart".  Sign up information is provided on this After Visit Summary.  MyChart is used to connect with patients for Virtual Visits (Telemedicine).  Patients are able to view lab/test results, encounter notes, upcoming appointments, etc.  Non-urgent messages can be sent to your provider as well.   To learn more about what you can do with MyChart, go to [email or web address].    Your next appointment:   5 month(s)  The format for your next appointment:   In Person  Provider:   Kate Sable, MD    Other Instructions N/a

## 2021-04-23 NOTE — Progress Notes (Signed)
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   Past Medical History    Past Medical History:  Diagnosis Date   Asthma    Cancer ([place])    esophageal   CKD (chronic kidney disease), stage III ([place])    a. 12/2020 Renal duplex: No RAS.   Eczema    Fatty liver    GERD (gastroesophageal reflux disease)    Heart murmur    History of echocardiogram    a. 12/2020 Echo: EF 55-60%, no rwma, triv MR.   Hypertension    Hypothyroidism    MI (myocardial infarction) ([place])    a. 12/2020 in setting of SCAD (dRCA)-->Med rx.   Multinodular goiter    Sleep apnea    Spontaneous dissection of coronary artery    a. 12/2020 Cath: LM nl, LAD nl, LCX nl, RCA 80d (most consistent w/ SCAD). EF 65%-->Med Rx.   Past Surgical History:  Procedure Laterality Date   ABDOMINAL HYSTERECTOMY     CESAREAN SECTION     COLONOSCOPY     COLONOSCOPY WITH PROPOFOL N/A 03/15/2020   Procedure: COLONOSCOPY WITH PROPOFOL;  Surgeon: Lesly Rubenstein, MD;  Location: [facility] ENDOSCOPY;  Service: Endoscopy;  Laterality: N/A;   DIAGNOSTIC LAPAROSCOPY     ESOPHAGECTOMY     ESOPHAGOGASTRODUODENOSCOPY (EGD) WITH PROPOFOL N/A 03/15/2020   Procedure: ESOPHAGOGASTRODUODENOSCOPY (EGD) WITH PROPOFOL;  Surgeon: Lesly Rubenstein, MD;  Location: [facility] ENDOSCOPY;  Service: Endoscopy;  Laterality: N/A;   EXPLORATORY LAPAROTOMY     LEFT HEART CATH AND CORONARY ANGIOGRAPHY N/A 01/06/2021   Procedure: LEFT HEART CATH AND CORONARY ANGIOGRAPHY;  Surgeon: Andrez Grime, MD;  Location: [facility];  Service: Cardiovascular;  Laterality: N/A;   THYROID LOBECTOMY     TUBAL LIGATION     UPPER GI ENDOSCOPY       Allergies  No Known Allergies  History of Present Illness    60 year old female with past medical history of SCAD, HTN, OSA, GERD, CKD III, Asthma, and hypothyroidism.  She previously presented to [facility] ED on 01[facility]2021 with left chest pain that radiated to her left arm and increased HsTrop. Cardiac catheretization showed 80% distal RCA stenosis which appeared consistent with SCAD. Echo during admission showed normal LV function with EF 55-60%. Medical rx was recommended. On initial follow-up the patient was doing well with no reported recurrent chest pain. Unfortunately, on 01/29/2021 she presented back to the ED with chest pain that was similar to her initial presentation. Her chest pain resolved after sublingual nitro without any recurrent chest pain. Imdur dose was increased from 60 to 120 mg daily and she was discharged home. She was last seen in the clinic on 03/07/2021 and reported that home blood pressures were elevated in the 150s to 180s. She was started on Amlodipine 5 Mg and instructed to continue monitoring home b/p.   Today she is doing well. She has been chest pain free since her last visit. She has noticed that she is frequently fatigued during the day and is in the process of obtaining a home BiPap for OSA mgmt. She is doing a great job of keeping a record of her home  b/p that have improved since last visit. Average pressure at home seems to be in the 120s with only occasional readings in the 130s. She denies chest pain, palpitations, pnd, orthopnea, n, v, dizziness, syncope, edema, weight gain, or early satiety.    Home Medications    Current Outpatient Medications  Medication Sig Dispense Refill   albuterol (VENTOLIN HFA) 108 (90 Base) MCG/ACT inhaler Inhale 2 puffs into the lungs every 6 (six) hours as needed for wheezing or shortness of breath.     amLODipine (NORVASC) 5 MG tablet Take 1 tablet (5 mg total) by mouth daily. 90 tablet 1   aspirin 81 MG EC tablet Take 1 tablet  (81 mg total) by mouth daily. Swallow whole. 90 tablet 0   atorvastatin (LIPITOR) 40 MG tablet Take 1 tablet (40 mg total) by mouth daily. 90 tablet 0   azelastine (ASTELIN) 0.1 % nasal spray Place 2 sprays into both nostrils 2 (two) times daily. Use in each nostril as directed     Calcium Citrate-Vitamin D (CALCIUM CITRATE + PO) Take by mouth daily in the afternoon.     fluticasone (FLONASE) 50 MCG/ACT nasal spray Place into both nostrils daily.     isosorbide mononitrate (IMDUR) 120 MG 24 hr tablet Take 1 tablet (120 mg total) by mouth daily. 90 tablet 1   levothyroxine (SYNTHROID) 50 MCG tablet Take 50 mcg by mouth daily before breakfast.     metoprolol succinate (TOPROL-XL) 25 MG 24 hr tablet Take 1 tablet (25 mg total) by mouth daily. 90 tablet 0   nitroGLYCERIN (NITROSTAT) 0.4 MG SL tablet Place 1 tablet (0.4 mg total) under the tongue every 5 (five) minutes x 3 doses as needed for up to 10 doses for chest pain. 10 tablet 0   VITAMIN D PO Take by mouth daily.     No current facility-administered medications for this visit.    Review of Systems    She denies chest pain, palpitations, pnd, orthopnea, n, v, dizziness, syncope, edema, weight gain, or early satiety.  All other systems reviewed and are otherwise negative except as noted above.   Physical Exam    VS:  BP 126/72 (BP Location: Left Arm, Patient Position: Sitting, Cuff Size: Large)   Pulse 71   Ht 5\' 2"  (1.575 m)   Wt 200 lb 6 oz (90.9 kg)   SpO2 97%   BMI 36.65 kg/m  , BMI Body mass index is 36.65 kg/m.     GEN: Obese well developed, in no acute distress. HEENT: normal. Neck: Supple, no JVD, carotid bruits, or masses. Cardiac: RRR, no murmurs, rubs, or gallops. No clubbing, cyanosis, edema.  Radials/PT 2+ and equal bilaterally.  Respiratory:  Respirations regular and unlabored, clear to auscultation bilaterally. GI: Soft, nontender, nondistended, BS + x 4. MS: no deformity or atrophy. Skin: warm and dry, no  rash. Neuro:  Strength and sensation are intact. Psych: Normal affect.  Accessory Clinical Findings      Lab Results  Component Value Date   WBC 6.0 01/30/2021   HGB 14.0 01/30/2021   HCT 41.9 01/30/2021   MCV 90.3 01/30/2021   PLT 172 01/30/2021   Lab Results  Component Value Date   CREATININE 1.10 (H) 01/30/2021   BUN 13 01/30/2021   NA 140 01/30/2021   K 3.5 01/30/2021   CL 105 01/30/2021   CO2 30 01/30/2021   Lab Results  Component Value Date   ALT 23 11/05/2011   AST 21 11/05/2011  ALKPHOS 124 11/05/2011   BILITOT 1.5 (H) 11/05/2011   Lab Results  Component Value Date   CHOL 101 01/30/2021   HDL 44 01/30/2021   LDLCALC 43 01/30/2021   TRIG 70 01/30/2021   CHOLHDL 2.3 01/30/2021    Assessment & Plan    1.  CAD/Spontaneous coronary artery dissection: Pt presented w/ chest pain and NSTEMI in 12/2020 w/ catheretization on revealing an 80% distal RCA stenosis which appeared consistent with SCAD. She was hospitalized again 3 weeks later with another episode of chest pain that had similar presentation requiring adjustment in Imdur dosing. Her blood pressure control has greatly improved since last visit. She has been chest pain free since last visit. Continue ASA therapy for 1 year. Continue Imdur, Amlodipine, Lipitor, Metoprolol, and PRN Nitro.  2. Essential HTN: B/P reading of 126/72 in the office today. She has been doing a great job logging her home b/p in a book. On review her pressure is generally running in the 120s. We talked about exercise and how weight loss can help improve her b/p. She is mostly sedentary as she works from home on the computer most of the day. Encouraged her to dedicate at least 30 minutes to moderate exercise daily. Continue Amlodipine, Imdur, Metoprolol.  3. HL: LDL 43.  Cont high potency statin rx.  4. OSA: Had sleep study on 11/1 and is in the process of obtaining a home BiPap.  5. Hypothyroidism: Recent TSH of 1.7 on 01/30/2021. Continue  Levothyroxine.  6. Disposition: Follow-up in 5 months with Dr. Garen Lah or sooner if necessary.   Murray Hodgkins, NP 04/23/2021, 1:26 PM

## 2021-06-19 ENCOUNTER — Encounter: Payer: Self-pay | Admitting: Internal Medicine

## 2021-06-19 ENCOUNTER — Ambulatory Visit: Payer: BC Managed Care – PPO | Admitting: Internal Medicine

## 2021-06-19 ENCOUNTER — Other Ambulatory Visit: Payer: Self-pay

## 2021-06-19 VITALS — BP 132/86 | HR 67 | Temp 97.9°F | Ht 62.0 in | Wt 206.2 lb

## 2021-06-19 DIAGNOSIS — G4733 Obstructive sleep apnea (adult) (pediatric): Secondary | ICD-10-CM

## 2021-06-19 NOTE — Progress Notes (Signed)
Name: Lorraine Jimenez MRN: [id number] DOB: [birth year]     CONSULTATION DATE:  REFERRING MD : 06/19/2021   CHIEF COMPLAINT: excessive daytime sleepiness and high blood pressurs   HISTORY OF PRESENT ILLNESS: Patient with a previous diagnosis of obstructive sleep apnea in the past Diagnosed 5 to 10 years ago Patient has had several issues with masks Currently has hypertension Patient noted to have more heart problems  she wants to really try getting aggressive therapy for sleep apnea   Split-night sleep study in November 2022 shows severe sleep apnea AHI of 95 Therapeutic intervention with BiPAP 23/17 was successful Patient is here for follow-up assessment   Compliance report reviewed in detail with patient 93% compliance for days 87 compliance for greater than 4 hours The auto BiPAP max IPAP is 23 min EPAP is 10 Pressure support 6 cm of water pressure which was suggested as initial therapy Her AHI is reduced however only to 19.9, previous AHI is 95    PAST MEDICAL HISTORY :   has a past medical history of Asthma, Cancer ([place]), CKD (chronic kidney disease), stage III ([place]), Eczema, Fatty liver, GERD (gastroesophageal reflux disease), Heart murmur, History of echocardiogram, Hypertension, Hypothyroidism, MI (myocardial infarction) ([place]), Multinodular goiter, Sleep apnea, and Spontaneous dissection of coronary artery.  has a past surgical history that includes Cesarean section; Colonoscopy; Esophagectomy; Exploratory laparotomy; Abdominal hysterectomy; Thyroid lobectomy; Tubal ligation; Upper gi endoscopy; Diagnostic laparoscopy; Colonoscopy with propofol (N/A, 03/15/2020); Esophagogastroduodenoscopy (egd) with propofol (N/A, 03/15/2020); and LEFT HEART CATH AND CORONARY ANGIOGRAPHY (N/A, 01/06/2021). Prior to Admission medications   Medication Sig Start Date End Date Taking? Authorizing Provider  albuterol (VENTOLIN HFA) 108 (90 Base) MCG/ACT inhaler Inhale 2 puffs into the lungs  every 6 (six) hours as needed for wheezing or shortness of breath.    [provider]  aspirin EC 81 MG EC tablet Take 1 tablet (81 mg total) by mouth daily. Swallow whole. 01/09/21 04/09/21  Terrilee Croak, MD  atorvastatin (LIPITOR) 40 MG tablet Take 1 tablet (40 mg total) by mouth daily. 01/09/21 04/09/21  Terrilee Croak, MD  azelastine (ASTELIN) 0.1 % nasal spray Place 2 sprays into both nostrils 2 (two) times daily. Use in each nostril as directed    [provider]  Calcium Citrate-Vitamin D (CALCIUM CITRATE + PO) Take by mouth daily in the afternoon.    [provider]  fluticasone (FLONASE) 50 MCG/ACT nasal spray Place into both nostrils daily.    [provider]  isosorbide mononitrate (IMDUR) 60 MG 24 hr tablet Take 1 tablet (60 mg total) by mouth 2 (two) times daily. 02/28/21 05/29/21  Kate Sable, MD  levothyroxine (SYNTHROID) 50 MCG tablet Take 50 mcg by mouth daily before breakfast.    [provider]  metoprolol succinate (TOPROL-XL) 25 MG 24 hr tablet Take 1 tablet (25 mg total) by mouth daily. 01/09/21 04/09/21  Terrilee Croak, MD  nitroGLYCERIN (NITROSTAT) 0.4 MG SL tablet Place 1 tablet (0.4 mg total) under the tongue every 5 (five) minutes x 3 doses as needed for up to 10 doses for chest pain. 01/08/21   Terrilee Croak, MD  VITAMIN D PO Take by mouth daily.    [provider]   No Known Allergies  FAMILY HISTORY:  family history includes COPD in her mother; Coronary artery disease in her father; Diabetes Mellitus II in her mother; Heart attack in her father; Heart disease in her father; Hypertension in her mother and sister; Hypothyroidism in her mother;  Leukemia in her father; Thyroid disease in her mother and sister. SOCIAL HISTORY:  reports that she has never smoked. She has never used smokeless tobacco. She reports current alcohol use of about 1.0 standard drink per week. She reports that she does not use drugs.   Review of  Systems:  Gen:  Denies  fever, sweats, chills weight loss  HEENT: Denies blurred vision, double vision, ear pain, eye pain, hearing loss, nose bleeds, sore throat Cardiac:  No dizziness, chest pain or heaviness, chest tightness,edema, No JVD Resp:   No cough, -sputum production, -shortness of breath,-wheezing, -hemoptysis,  Other:  All other systems negative   ALL OTHER ROS ARE NEGATIVE  BP 132/86 (BP Location: Left Arm, Cuff Size: Normal)    Pulse 67    Temp 97.9 F (36.6 C) (Temporal)    Ht 5\' 2"  (1.575 m)    Wt 206 lb 3.2 oz (93.5 kg)    SpO2 100%    BMI 37.71 kg/m    Physical Examination:   General Appearance: No distress  EYES PERRLA, EOM intact.   NECK Supple, No JVD Pulmonary: normal breath sounds, No wheezing.  CardiovascularNormal S1,S2.  No m/r/g.   ALL OTHER ROS ARE NEGATIVE   ASSESSMENT AND PLAN SYNOPSIS  61 year old obese white female with severe sleep apnea AHI of 95   OSA severe Although her settings have decreased her AHI to 19.9, Patient still remains with increased AHI Recommend follow-up with sleep certified physician to assess other modalities   Obesity -recommend significant weight loss -recommend changing diet  Deconditioned state -Recommend increased daily activity and exercise   Patient  satisfied with Plan of action and management. All questions answered  Follow-up in 4 weeks  Total time spent 24 minutes  Corrin Parker, M.D.  [facility]  Medical Director [place] Director [facility] Cardio-Pulmonary Department

## 2021-06-19 NOTE — Patient Instructions (Addendum)
Follow-up February 22 at 12:00 with Dr. Dossie Der for complex sleep apnea  Continue current settings as prescribed for OSA therapy Recommend weight loss

## 2021-07-03 ENCOUNTER — Other Ambulatory Visit: Payer: Self-pay | Admitting: *Deleted

## 2021-07-03 MED ORDER — ASPIRIN 81 MG PO TBEC: 81.0000 mg | DELAYED_RELEASE_TABLET | Freq: Every day | ORAL | 1 refills | Status: DC

## 2021-07-03 MED ORDER — ATORVASTATIN CALCIUM 40 MG PO TABS: 40.0000 mg | ORAL_TABLET | Freq: Every day | ORAL | 1 refills | Status: DC

## 2021-07-03 MED ORDER — METOPROLOL SUCCINATE ER 25 MG PO TB24: 25.0000 mg | ORAL_TABLET | Freq: Every day | ORAL | 1 refills | Status: DC

## 2021-07-23 ENCOUNTER — Other Ambulatory Visit: Payer: Self-pay

## 2021-07-23 ENCOUNTER — Encounter: Payer: Self-pay | Admitting: Pulmonary Disease

## 2021-07-23 ENCOUNTER — Ambulatory Visit: Payer: BC Managed Care – PPO | Admitting: Pulmonary Disease

## 2021-07-23 VITALS — BP 128/74 | HR 68 | Temp 97.9°F | Ht 62.0 in | Wt 208.6 lb

## 2021-07-23 DIAGNOSIS — E669 Obesity, unspecified: Secondary | ICD-10-CM | POA: Diagnosis not present

## 2021-07-23 DIAGNOSIS — G4733 Obstructive sleep apnea (adult) (pediatric): Secondary | ICD-10-CM | POA: Diagnosis not present

## 2021-07-23 DIAGNOSIS — G4730 Sleep apnea, unspecified: Secondary | ICD-10-CM

## 2021-07-23 NOTE — Progress Notes (Signed)
[facility]  Chief Complaint  Patient presents with   Follow-up    Wearing bipap avg 5-6hr nightly. Pressure is strong and causes mask to leak    Past Surgical History:  She  has a past surgical history that includes Cesarean section; Colonoscopy; Esophagectomy; Exploratory laparotomy; Abdominal hysterectomy; Thyroid lobectomy; Tubal ligation; Upper gi endoscopy; Diagnostic laparoscopy; Colonoscopy with propofol (N/A, 03/15/2020); Esophagogastroduodenoscopy (egd) with propofol (N/A, 03/15/2020); and LEFT HEART CATH AND CORONARY ANGIOGRAPHY (N/A, 01/06/2021).  Past Medical History:  Asthma, Esophageal Cancer, CKD 3, Eczema, GERD, Fatty liver, HTN, Hypothyroidism, Multinodular goiter, CAD  Constitutional:  BP 128/74 (BP Location: Left Arm, Cuff Size: Normal)    Pulse 68    Temp 97.9 F (36.6 C) (Temporal)    Ht 5\' 2"  (1.575 m)    Wt 208 lb 9.6 oz (94.6 kg)    SpO2 96%    BMI 38.15 kg/m   Brief Summary:  Lorraine Jimenez is a 61 y.o. female with obstructive sleep apnea.      Subjective:   She was previously seen by Dr. Mortimer Fries.  She had sleep study in November 2022.  This showed very severe obstructive sleep apnea with significant oxygen desaturation.  She failed CPAP during titration portion of the study do to treatment emergent centrals and persistent obstructive events.  She did better with Bipap.  She was started on auto Bipap with PS 6, min EPAP 10, max IPAP 23 cm H2O.  She is sleeping much better, and has more energy.  She is using a full face mask.  She has trouble with mask irritating the bridge of her nose.  She is also having frequent mask leak.  This will wake her up, and noise will wake her husband up.  Not having sinus congestion, dry mouth, or aerophagia.   Physical Exam:   Appearance - well kempt   ENMT - no sinus tenderness, no oral exudate, no LAN, Mallampati 4 airway, no stridor  Respiratory - equal breath sounds  bilaterally, no wheezing or rales  CV - s1s2 regular rate and rhythm, no murmurs  Ext - no clubbing, no edema  Skin - no rashes  Psych - normal mood and affect   Pulmonary testing:  PFT 12/06/20 >> FEV1 1.79 (76%), FEV1% 74, TLC 3.98 (86%), DLCO 89%  Sleep Tests:  PSG 04/01/21 >> AHI 95.1, SpO2 low 60%; Bipap 23/17 cm H2O >> AHI 8.5 Auto Bipap 06/21/21 to 07/20/21 >> used on 29 of 30 nights with average 6 hrs 38 min.  Average AHI 19.1 with median Bipap 22/16 and 95 th percentile Bipap 23/17 cm H2O.  Cardiac Tests:  Echo 01/05/21 >> EF 55 to 60%  Social History:  She  reports that she has never smoked. She has never used smokeless tobacco. She reports current alcohol use of about 1.0 standard drink per week. She reports that she does not use drugs.  Family History:  Her family history includes COPD in her mother; Coronary artery disease in her father; Diabetes Mellitus II in her mother; Heart attack in her father; Heart disease in her father; Hypertension in her mother and sister; Hypothyroidism in her mother; Leukemia in her father; Thyroid disease in her mother and sister.     Assessment/Plan:   Obstructive sleep apnea. - she is compliant with Bipap and reports benefit from therapy - she uses [employer] for her DME - will have her refit to a Resmed Airfit F30 full face mask -  will have her auto Bipap settings changed to min EPAP 6, max IPAP 22, PS 4 cm H2O  Obesity. - discussed how her weight can impact her sleep apnea control  Time Spent Involved in Patient Care on Day of Examination:  38 minutes  Follow up:   Patient Instructions  Will have [employer] change you to a Resmed Airfit F30 full face mask and change your auto Bipap settings  Call if you are still having trouble with mask fit and air leak  Follow up in 6 months  Medication List:   Allergies as of 07/23/2021   No Known Allergies      Medication List        Accurate as of July 23, 2021  4:45 PM. If you  have any questions, ask your nurse or doctor.          albuterol 108 (90 Base) MCG/ACT inhaler Commonly known as: VENTOLIN HFA Inhale 2 puffs into the lungs every 6 (six) hours as needed for wheezing or shortness of breath.   amLODipine 5 MG tablet Commonly known as: NORVASC Take 1 tablet (5 mg total) by mouth daily.   aspirin 81 MG EC tablet Take 1 tablet (81 mg total) by mouth daily. Swallow whole.   atorvastatin 40 MG tablet Commonly known as: LIPITOR Take 1 tablet (40 mg total) by mouth daily.   azelastine 0.1 % nasal spray Commonly known as: ASTELIN Place 2 sprays into both nostrils 2 (two) times daily. Use in each nostril as directed   CALCIUM CITRATE + PO Take by mouth daily in the afternoon.   fluticasone 50 MCG/ACT nasal spray Commonly known as: FLONASE Place into both nostrils daily.   isosorbide mononitrate 120 MG 24 hr tablet Commonly known as: IMDUR Take 1 tablet (120 mg total) by mouth daily.   levothyroxine 50 MCG tablet Commonly known as: SYNTHROID Take 50 mcg by mouth daily before breakfast.   metoprolol succinate 25 MG 24 hr tablet Commonly known as: TOPROL-XL Take 1 tablet (25 mg total) by mouth daily.   nitroGLYCERIN 0.4 MG SL tablet Commonly known as: NITROSTAT Place 1 tablet (0.4 mg total) under the tongue every 5 (five) minutes x 3 doses as needed for up to 10 doses for chest pain.   VITAMIN D PO Take by mouth daily.        Signature:  Chesley Mires, MD [place] Pager - [phone] 07/23/2021, 4:45 PM

## 2021-07-23 NOTE — Patient Instructions (Signed)
Will have [employer] change you to a Resmed Airfit F30 full face mask and change your auto Bipap settings  Call if you are still having trouble with mask fit and air leak  Follow up in 6 months

## 2021-08-25 ENCOUNTER — Other Ambulatory Visit: Payer: Self-pay

## 2021-08-25 MED ORDER — ISOSORBIDE MONONITRATE ER 120 MG PO TB24: 120.0000 mg | ORAL_TABLET | Freq: Every day | ORAL | 0 refills | Status: DC

## 2021-08-25 MED ORDER — AMLODIPINE BESYLATE 5 MG PO TABS: 5.0000 mg | ORAL_TABLET | Freq: Every day | ORAL | 0 refills | Status: DC

## 2021-09-22 ENCOUNTER — Telehealth: Payer: Self-pay

## 2021-09-22 ENCOUNTER — Ambulatory Visit (INDEPENDENT_AMBULATORY_CARE_PROVIDER_SITE_OTHER): Payer: BC Managed Care – PPO | Admitting: Cardiology

## 2021-09-22 ENCOUNTER — Encounter: Payer: Self-pay | Admitting: Cardiology

## 2021-09-22 VITALS — BP 134/64 | HR 62 | Ht 62.0 in | Wt 210.0 lb

## 2021-09-22 DIAGNOSIS — I2542 Coronary artery dissection: Secondary | ICD-10-CM

## 2021-09-22 DIAGNOSIS — Z6838 Body mass index (BMI) 38.0-38.9, adult: Secondary | ICD-10-CM | POA: Diagnosis not present

## 2021-09-22 DIAGNOSIS — I2510 Atherosclerotic heart disease of native coronary artery without angina pectoris: Secondary | ICD-10-CM

## 2021-09-22 DIAGNOSIS — I10 Essential (primary) hypertension: Secondary | ICD-10-CM

## 2021-09-22 MED ORDER — SEMAGLUTIDE-WEIGHT MANAGEMENT 1 MG/0.5ML ~~LOC~~ SOAJ: 1.0000 mg | SUBCUTANEOUS | 0 refills | Status: AC

## 2021-09-22 MED ORDER — SEMAGLUTIDE-WEIGHT MANAGEMENT 0.5 MG/0.5ML ~~LOC~~ SOAJ: 0.5000 mg | SUBCUTANEOUS | 0 refills | Status: AC

## 2021-09-22 MED ORDER — SEMAGLUTIDE-WEIGHT MANAGEMENT 0.25 MG/0.5ML ~~LOC~~ SOAJ: 0.2500 mg | SUBCUTANEOUS | 0 refills | Status: AC

## 2021-09-22 MED ORDER — SEMAGLUTIDE-WEIGHT MANAGEMENT 2.4 MG/0.75ML ~~LOC~~ SOAJ: 2.4000 mg | SUBCUTANEOUS | 8 refills | Status: DC

## 2021-09-22 MED ORDER — SEMAGLUTIDE-WEIGHT MANAGEMENT 1.7 MG/0.75ML ~~LOC~~ SOAJ: 1.7000 mg | SUBCUTANEOUS | 0 refills | Status: AC

## 2021-09-22 NOTE — Progress Notes (Signed)
?Cardiology Office Note:   ? ?Date:  09/22/2021  ? ?ID:  Donald Jacque Barreras, DOB 1960/10/02, MRN [id number] ? ?PCP:  Earlie Counts, FNP ?  ?[facility] Providers ?Cardiologist:  Kate Sable, MD    ? ?Referring MD: Earlie Counts, FNP  ? ?Chief Complaint  ?Patient presents with  ? Other  ?  5 month follow up -- Meds reviewed verbally with patient.   ? ? ?History of Present Illness:   ? ?Lorraine Jimenez is a 61 y.o. 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 ? ?Patient is being seen due to history of scad, CAD, obesity.  Feels well overall, denies chest pain, blood pressures are better controlled since starting amlodipine.  She works from home, sedentary work.  Gained weight since last visit.  States not being able to lay on right side with BiPAP mask.  Has appointment with sleep specialist coming up. ? ? ?Prior notes ?Echo 01/05/2021 showed EF 55 to 60%. ?Left heart cath 01/06/2021 distal RCA 80%, consistent with SCAD. ? ?Past Medical History:  ?Diagnosis Date  ? Asthma   ? Cancer [facility])   ? esophageal  ? CKD (chronic kidney disease), stage III ([place])   ? a. 12/2020 Renal duplex: No RAS.  ? Eczema   ? Fatty liver   ? GERD (gastroesophageal reflux disease)   ? Heart murmur   ? History of echocardiogram   ? a. 12/2020 Echo: EF 55-60%, no rwma, triv MR.  ? Hypertension   ? Hypothyroidism   ? MI (myocardial infarction) ([place])   ? a. 12/2020 in setting of SCAD (dRCA)-->Med rx.  ? Multinodular goiter   ? Sleep apnea   ? Spontaneous dissection of coronary artery   ? a. 12/2020 Cath: LM nl, LAD nl, LCX nl, RCA 80d (most consistent w/ SCAD). EF 65%-->Med Rx.  ? ? ?Past Surgical History:  ?Procedure Laterality Date  ? ABDOMINAL HYSTERECTOMY    ? CESAREAN SECTION    ? COLONOSCOPY    ? COLONOSCOPY WITH PROPOFOL N/A 03/15/2020  ? Procedure: COLONOSCOPY WITH PROPOFOL;  Surgeon: Lesly Rubenstein, MD;  Location[facility] ENDOSCOPY;  Service: Endoscopy;  Laterality: N/A;  ? DIAGNOSTIC  LAPAROSCOPY    ? ESOPHAGECTOMY    ? ESOPHAGOGASTRODUODENOSCOPY (EGD) WITH PROPOFOL N/A 03/15/2020  ? Procedure: ESOPHAGOGASTRODUODENOSCOPY (EGD) WITH PROPOFOL;  Surgeon: Lesly Rubenstein, MD;  Location: [facility] ENDOSCOPY;  Service: Endoscopy;  Laterality: N/A;  ? EXPLORATORY LAPAROTOMY    ? LEFT HEART CATH AND CORONARY ANGIOGRAPHY N/A 01/06/2021  ? Procedure: LEFT HEART CATH AND CORONARY ANGIOGRAPHY;  Surgeon: Andrez Grime, MD;  Location: [facility] CV LAB;  Service: Cardiovascular;  Laterality: N/A;  ? THYROID LOBECTOMY    ? TUBAL LIGATION    ? UPPER GI ENDOSCOPY    ? ? ?Current Medications: ?Current Meds  ?Medication Sig  ? albuterol (VENTOLIN HFA) 108 (90 Base) MCG/ACT inhaler Inhale 2 puffs into the lungs every 6 (six) hours as needed for wheezing or shortness of breath.  ? amLODipine (NORVASC) 5 MG tablet Take 1 tablet (5 mg total) by mouth daily.  ? aspirin 81 MG EC tablet Take 1 tablet (81 mg total) by mouth daily. Swallow whole.  ? atorvastatin (LIPITOR) 40 MG tablet Take 1 tablet (40 mg total) by mouth daily.  ? azelastine (ASTELIN) 0.1 % nasal spray Place 2 sprays into both nostrils 2 (two) times daily. Use in each nostril as directed  ?  Calcium Citrate-Vitamin D (CALCIUM CITRATE + PO) Take by mouth daily in the afternoon.  ? fluticasone (FLONASE) 50 MCG/ACT nasal spray Place into both nostrils daily.  ? isosorbide mononitrate (IMDUR) 120 MG 24 hr tablet Take 1 tablet (120 mg total) by mouth daily.  ? levothyroxine (SYNTHROID) 50 MCG tablet Take 50 mcg by mouth daily before breakfast.  ? metFORMIN (GLUCOPHAGE-XR) 500 MG 24 hr tablet Take 500 mg by mouth daily.  ? metoprolol succinate (TOPROL-XL) 25 MG 24 hr tablet Take 1 tablet (25 mg total) by mouth daily.  ? nitroGLYCERIN (NITROSTAT) 0.4 MG SL tablet Place 1 tablet (0.4 mg total) under the tongue every 5 (five) minutes x 3 doses as needed for up to 10 doses for chest pain.  ? Semaglutide-Weight Management 0.25 MG/0.5ML SOAJ Inject 0.25 mg into  the skin once a week for 28 days.  ? [START ON 10/21/2021] Semaglutide-Weight Management 0.5 MG/0.5ML SOAJ Inject 0.5 mg into the skin once a week for 28 days.  ? [START ON 11/19/2021] Semaglutide-Weight Management 1 MG/0.5ML SOAJ Inject 1 mg into the skin once a week for 28 days.  ? [START ON 12/18/2021] Semaglutide-Weight Management 1.7 MG/0.75ML SOAJ Inject 1.7 mg into the skin once a week for 28 days.  ? [START ON 01/16/2022] Semaglutide-Weight Management 2.4 MG/0.75ML SOAJ Inject 2.4 mg into the skin once a week.  ? VITAMIN D PO Take by mouth daily.  ?  ? ?Allergies:   Patient has no known allergies.  ? ?Social History  ? ?Socioeconomic History  ? Marital status: Married  ?  Spouse name: Not on file  ? Number of children: Not on file  ? Years of education: Not on file  ? Highest education level: Not on file  ?Occupational History  ? Not on file  ?Tobacco Use  ? Smoking status: Never  ? Smokeless tobacco: Never  ?Vaping Use  ? Vaping Use: Never used  ?Substance and Sexual Activity  ? Alcohol use: Yes  ?  Alcohol/week: 1.0 standard drink  ?  Types: 1 Standard drinks or equivalent per week  ? Drug use: Never  ? Sexual activity: Not on file  ?Other Topics Concern  ? Not on file  ?Social History Narrative  ? Not on file  ? ?Social Determinants of Health  ? ?Financial Resource Strain: Not on file  ?Food Insecurity: Not on file  ?Transportation Needs: Not on file  ?Physical Activity: Not on file  ?Stress: Not on file  ?Social Connections: Not on file  ?  ? ?Family History: ?The patient's family history includes COPD in her mother; Coronary artery disease in her father; Diabetes Mellitus II in her mother; Heart attack in her father; Heart disease in her father; Hypertension in her mother and sister; Hypothyroidism in her mother; Leukemia in her father; Thyroid disease in her mother and sister. There is no history of Breast cancer. ? ?ROS:   ?Please see the history of present illness.    ? All other systems reviewed and are  negative. ? ?EKGs/Labs/Other Studies Reviewed:   ? ?The following studies were reviewed today: ? ? ?EKG:  EKG is  ordered today.  The ekg ordered today demonstrates normal sinus rhythm, occasional PVCs. ? ?Recent Labs: ?01/05/2021: Magnesium 2.2 ?01/30/2021: BUN 13; Creatinine, Ser 1.10; Hemoglobin 14.0; Platelets 172; Potassium 3.5; Sodium 140; TSH 1.706  ?Recent Lipid Panel ?   ?Component Value Date/Time  ? CHOL 101 01/30/2021 0529  ? TRIG 70 01/30/2021 0529  ? HDL 44  01/30/2021 0529  ? CHOLHDL 2.3 01/30/2021 0529  ? VLDL 14 01/30/2021 0529  ? [place] 43 01/30/2021 0529  ? ? ? ?Risk Assessment/Calculations:   ? ? ?    ? ?Physical Exam:   ? ?VS:  BP 134/64 (BP Location: Left Arm, Patient Position: Sitting, Cuff Size: Normal)   Pulse 62   Ht '5\' 2"'$  (1.575 m)   Wt 210 lb (95.3 kg)   SpO2 98%   BMI 38.41 kg/m?    ? ?Wt Readings from Last 3 Encounters:  ?09/22/21 210 lb (95.3 kg)  ?07/23/21 208 lb 9.6 oz (94.6 kg)  ?06/19/21 206 lb 3.2 oz (93.5 kg)  ?  ? ?GEN:  Well nourished, well developed in no acute distress ?HEENT: Normal ?NECK: No JVD; No carotid bruits ?LYMPHATICS: No lymphadenopathy ?CARDIAC: RRR, no murmurs, rubs, gallops ?RESPIRATORY:  Clear to auscultation without rales, wheezing or rhonchi  ?ABDOMEN: Soft, non-tender, non-distended ?MUSCULOSKELETAL:  No edema; No deformity  ?SKIN: Warm and dry ?NEUROLOGIC:  Alert and oriented x 3 ?PSYCHIATRIC:  Normal affect  ? ?ASSESSMENT:   ? ?1. Coronary artery disease involving native coronary artery of native heart without angina pectoris   ?2. Spontaneous dissection of coronary artery   ?3. Primary hypertension   ?4. BMI 38.0-38.9,adult   ? ? ? ?PLAN:   ? ?In order of problems listed above: ? ?Spontaneous coronary artery dissection in the RCA, 80% distal RCA stenosis..  Denies chest pain.  Continue aspirin, Lipitor, Toprol-XL, Imdur 120 mg daily, amlodipine 5 mg daily for BP benefit.  Exercises up to 50 to 70% maximum predicted heart rate to limit arterial shear  stress.   ?Hypertension, BP now controlled.  Continue amlodipine 5 mg daily, Toprol-XL, Imdur 120 mg daily. ?Obesity, low-calorie diet, weight loss advised.  Start Wegovy to help with weight loss. ? ?Follow-up in 3 mon

## 2021-09-22 NOTE — Telephone Encounter (Signed)
PA started through Covermymeds  ? ?Lorraine Jimenez (Key: [id number]) ?Wegovy 0.'5MG'$ /0.5ML auto-injectors ? ?Lorraine Jimenez (Key: [id number]) ? ?Your information has been submitted to [place]. [employer] [place] will review the request and notify you of the determination decision directly, typically within 72 hours of receiving all information. ? ?You will also receive your request decision electronically. To check for an update later, open this request again from your dashboard. ? ?If [employer] [place] has not responded within the specified timeframe or if you have any questions about your PA submission, contact [facility] directly at [phone]. ?

## 2021-09-22 NOTE — Patient Instructions (Signed)
Medication Instructions:  ? ?Your physician has recommended you make the following change in your medication:  ? ? START taking Wegovy Sub Q injection once a week. See your Medication printout for dosages. ? ?*If you need a refill on your cardiac medications before your next appointment, please call your pharmacy* ? ? ?Lab Work: ?None ordered ?If you have labs (blood work) drawn today and your tests are completely normal, you will receive your results only by: ?MyChart Message (if you have MyChart) OR ?A paper copy in the mail ?If you have any lab test that is abnormal or we need to change your treatment, we will call you to review the results. ? ? ?Testing/Procedures: ?None ordered ? ? ?Follow-Up: ?At [facility], you and your health needs are our priority.  As part of our continuing mission to provide you with exceptional heart care, we have created designated Provider Care Teams.  These Care Teams include your primary Cardiologist (physician) and Advanced Practice Providers (APPs -  Physician Assistants and Nurse Practitioners) who all work together to provide you with the care you need, when you need it. ? ?We recommend signing up for the patient portal called "MyChart".  Sign up information is provided on this After Visit Summary.  MyChart is used to connect with patients for Virtual Visits (Telemedicine).  Patients are able to view lab/test results, encounter notes, upcoming appointments, etc.  Non-urgent messages can be sent to your provider as well.   ?To learn more about what you can do with MyChart, go to [email or web address].   ? ?Your next appointment:   ?3 month(s) ? ?The format for your next appointment:   ?In Person ? ?Provider:   ?You may see Kate Sable, MD or one of the following Advanced Practice Providers on your designated Care Team:   ?Murray Hodgkins, NP ?Christell Faith, PA-C ?Cadence Kathlen Mody, PA-C  ? ? ?Other Instructions ? ? ?Important Information About Sugar ? ? ? ? ? ? ?

## 2021-09-29 ENCOUNTER — Telehealth: Payer: Self-pay | Admitting: Cardiology

## 2021-09-29 NOTE — Telephone Encounter (Signed)
Called [place], [place] them aware patient should be on the the [facility] 0.'5MG'$ /0.5ML dose and there should be a PA on file. They stated that there computer is down at the moment and they would get this corrected and call the patient when it is resolved.  ? ?

## 2021-09-29 NOTE — Telephone Encounter (Signed)
Pt c/o medication issue: ? ?1. Name of Medication:  ? Semaglutide-Weight Management 0.25 MG/0.5ML SOAJ  ? ? ?2. How are you currently taking this medication (dosage and times per day)? Inject 0.25 mg into the skin once a week for  ? ?3. Are you having a reaction (difficulty breathing--STAT)?  No ? ?4. What is your medication issue? Pt states that pharmacy called and stated that they were needing info from [place] before filling prescription. Pt would like a callback. Please advise ? ?

## 2021-10-02 ENCOUNTER — Ambulatory Visit: Payer: BC Managed Care – PPO | Admitting: Pulmonary Disease

## 2021-10-11 IMAGING — MG MM DIGITAL SCREENING BILAT W/ TOMO AND CAD
8 series · 8 of 24 positions shown · non-contrast
Comparison: Previous exam(s).

CLINICAL DATA: Screening.

EXAM:
DIGITAL SCREENING BILATERAL MAMMOGRAM WITH TOMOSYNTHESIS AND CAD
TECHNIQUE: Bilateral screening digital craniocaudal and mediolateral oblique
mammograms were obtained. Bilateral screening digital breast
tomosynthesis was performed. The images were evaluated with
computer-aided detection.

[L CC synth-2D]
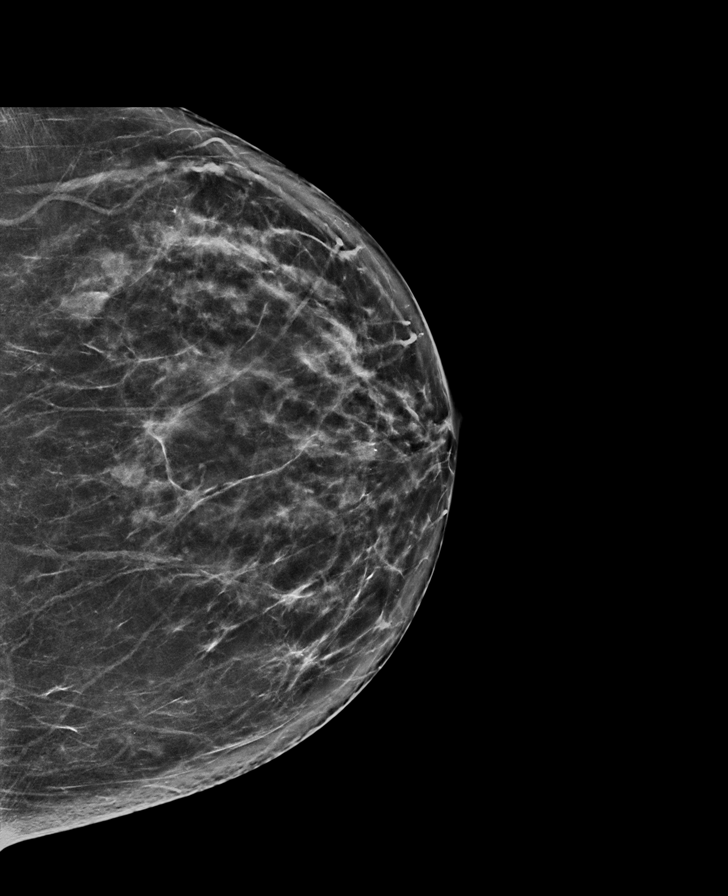

[L MLO synth-2D]
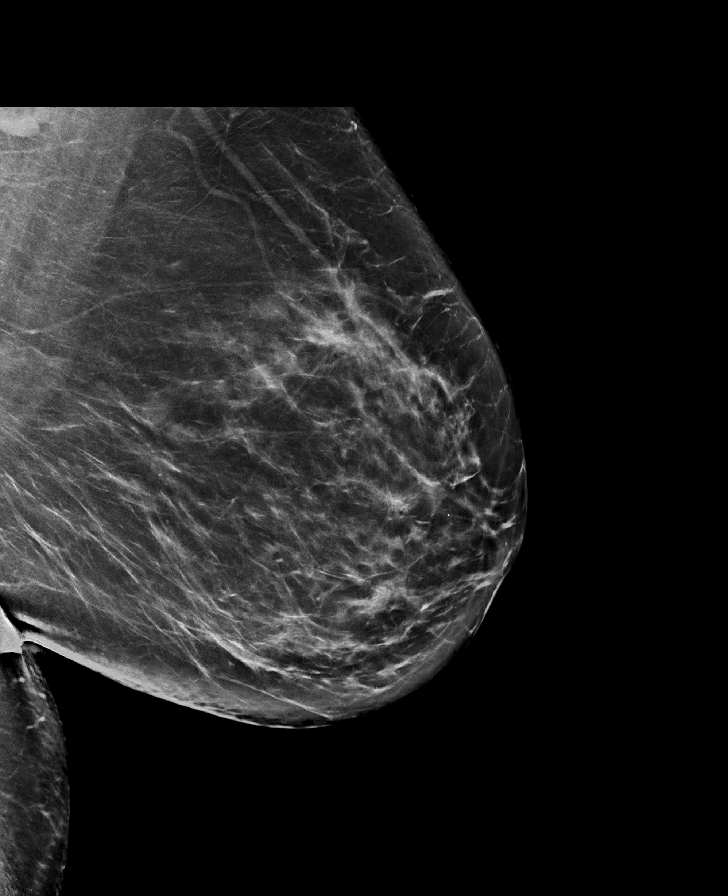

[R CC synth-2D]
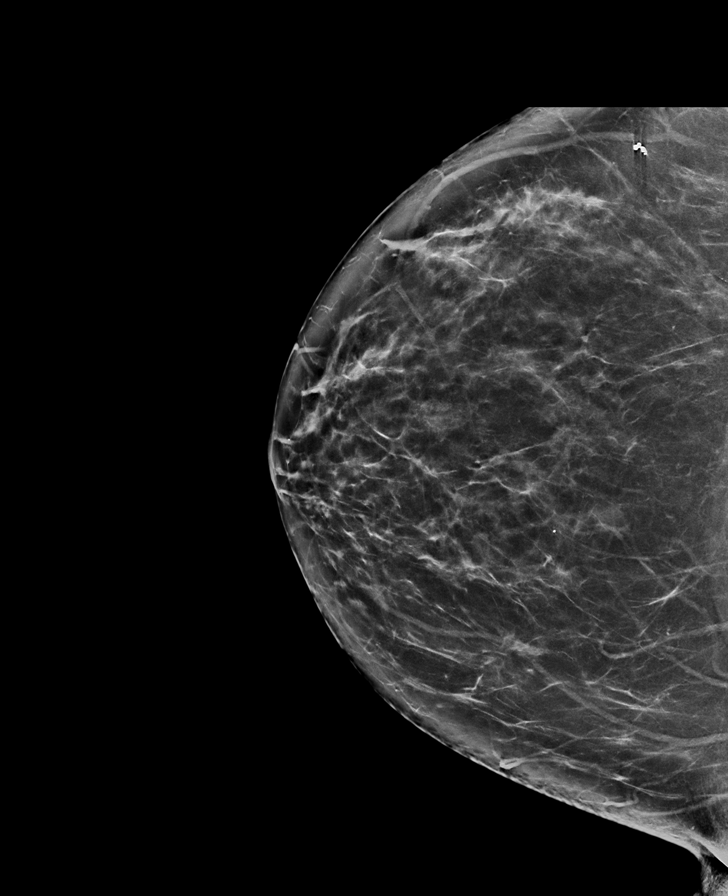

[R MLO synth-2D]
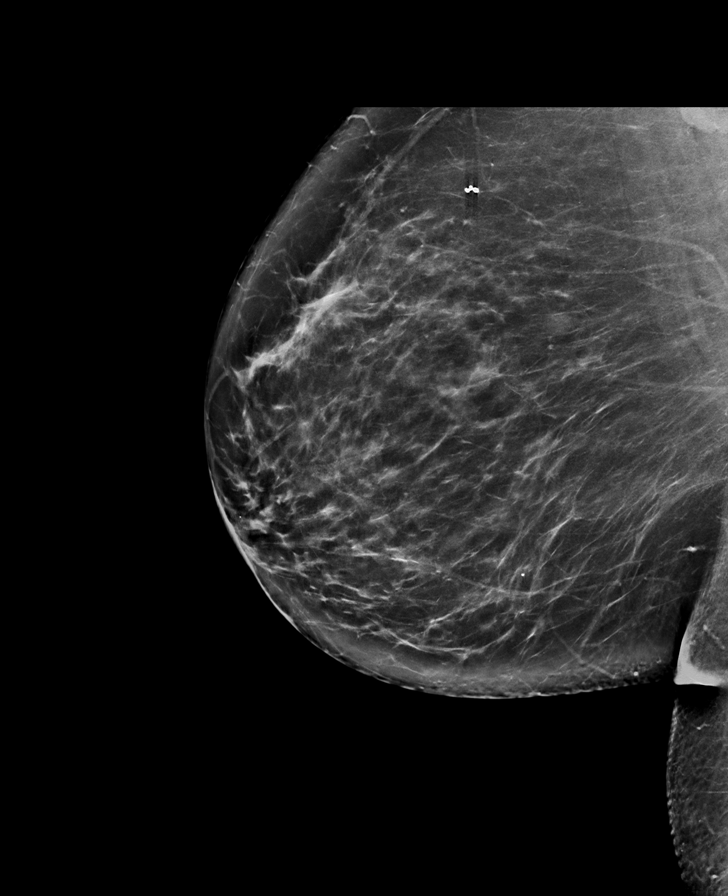

[R MLO tomo · tomo slice 47/92.0]
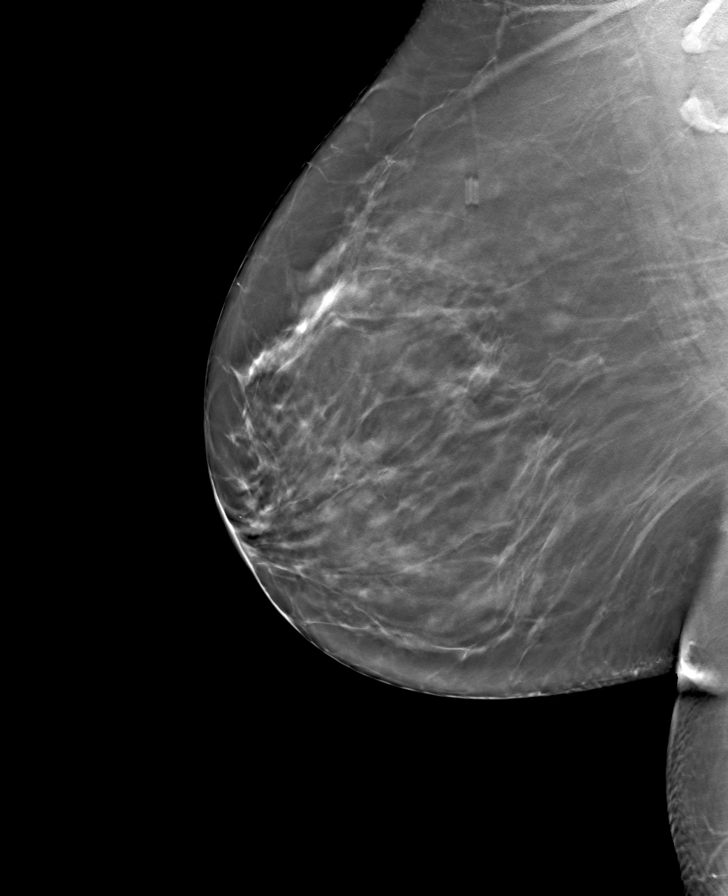

[L CC tomo · tomo slice 39/78.0]
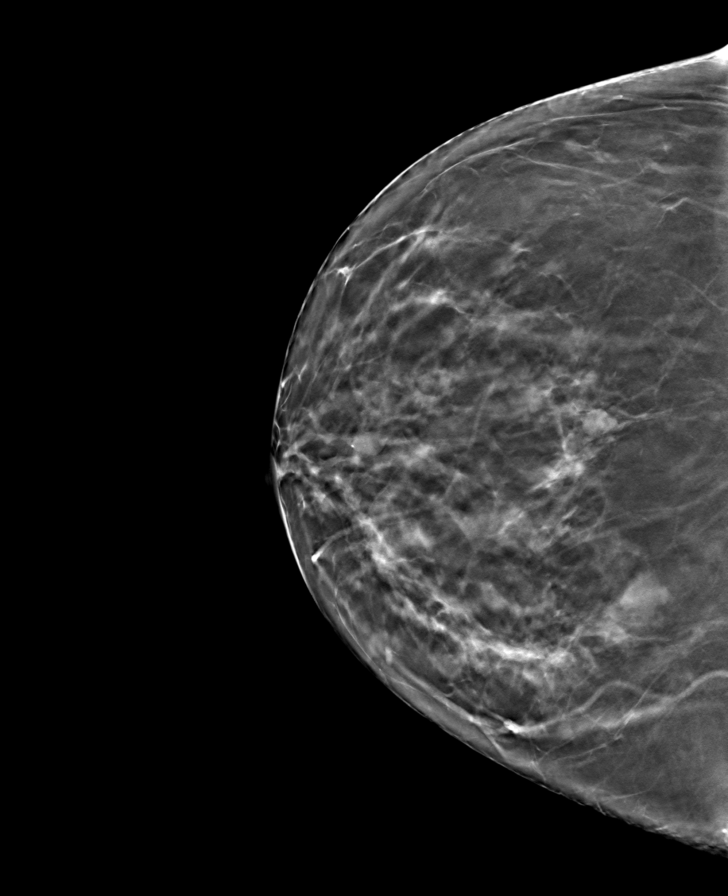

[L MLO tomo · tomo slice 48/95.0]
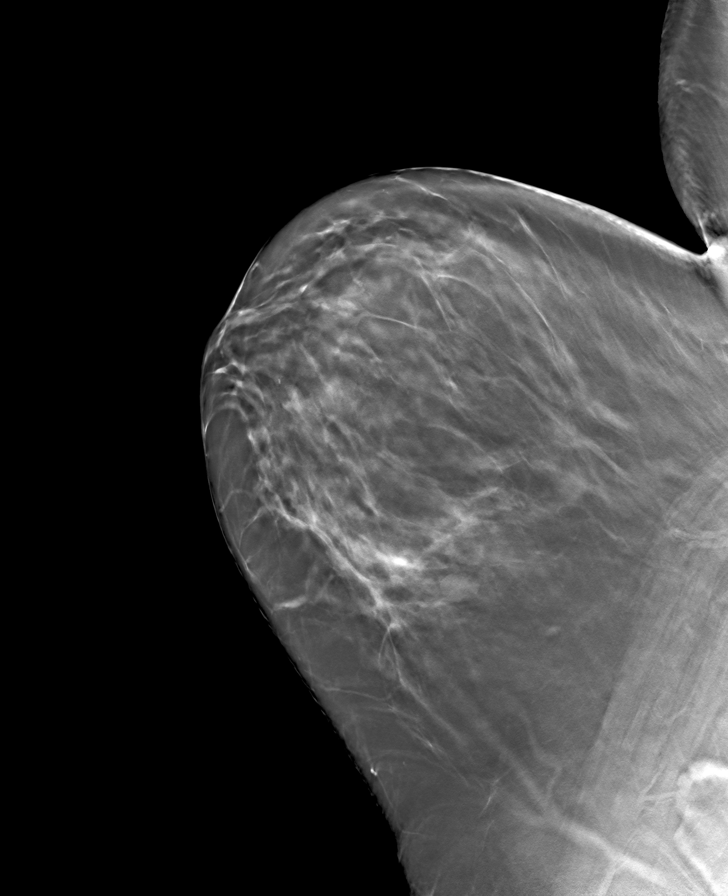

[R CC tomo · tomo slice 43/86.0]
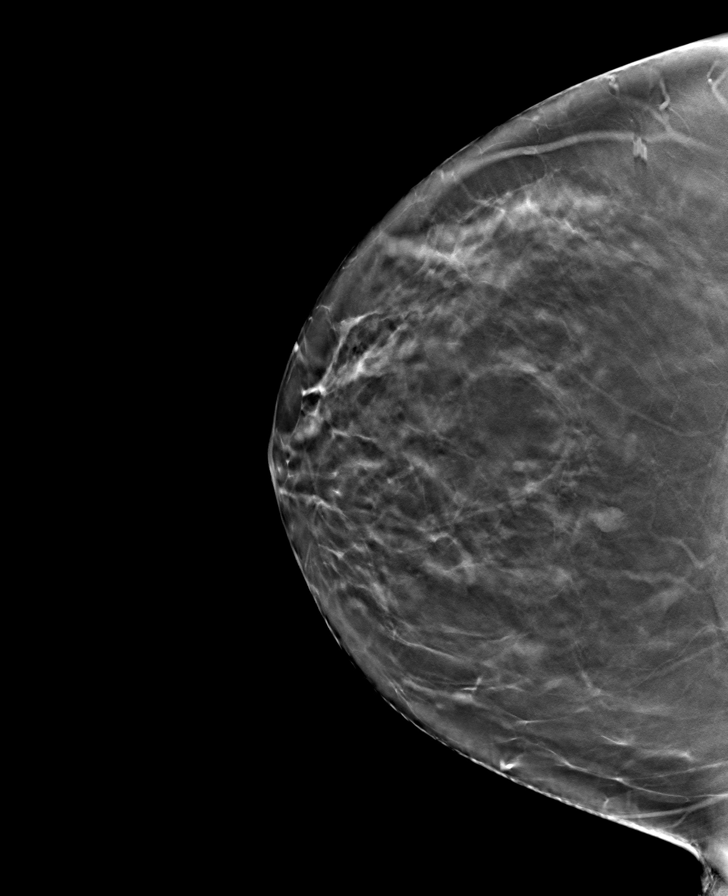

[8 of 24 positions shown; findings below may reference images not displayed]

ACR Breast Density Category c: The breast tissue is heterogeneously
dense, which may obscure small masses.
FINDINGS: There are no findings suspicious for malignancy.
IMPRESSION: No mammographic evidence of malignancy. A result letter of this
screening mammogram will be mailed directly to the patient.

RECOMMENDATION:
Screening mammogram in one year. (Code:Q3-W-BC3)

BI-RADS CATEGORY  1: Negative.

## 2021-10-17 ENCOUNTER — Ambulatory Visit: Payer: BC Managed Care – PPO | Admitting: Primary Care

## 2021-10-17 ENCOUNTER — Encounter: Payer: Self-pay | Admitting: Primary Care

## 2021-10-17 VITALS — BP 126/76 | HR 71 | Temp 97.7°F | Ht 62.0 in | Wt 206.4 lb

## 2021-10-17 DIAGNOSIS — G4733 Obstructive sleep apnea (adult) (pediatric): Secondary | ICD-10-CM | POA: Diagnosis not present

## 2021-10-17 DIAGNOSIS — G4730 Sleep apnea, unspecified: Secondary | ICD-10-CM

## 2021-10-17 NOTE — Patient Instructions (Addendum)
Recommendations: - Call and set up visit with [employer] to have pressure changed and have them get download from SD card/machine and fax to use  - Call me if still having daytime sleepiness on new pressure setting and if you are we will increase back to original settings 23/10   Orders: - Min EPAP 8, max IPAP 23 cm H2O  Follow-up: - August with Dr. Halford Chessman  or sooner if needed

## 2021-10-17 NOTE — Assessment & Plan Note (Signed)
-   Patient reports increased daytime sleepiness after BIPAP pressure settings were lowered to 22/6 in February. Original settings were 23/10/6. Pressure was decreased to mask leakage. Epworth score today was 15. We were unable to access compliance report from SD card, she will need to bring machine into [facility] for download. For now recommend increasing BIPAP pressure min EPAP 8/max IPAP 23. She will notify us on how she is doing after pressure settings have been changed. May need BIPAP titration if continues to have issues with mask leaks and pressure settings.

## 2021-10-17 NOTE — Progress Notes (Signed)
$'@Patient'X$  ID: Lorraine Jimenez, female    DOB: 06-25-1960, 61 y.o.   MRN: [id number]  Chief Complaint  Patient presents with   Follow-up    Recently decreased pressure, since she has been experiencing increased fatigue.      Referring provider: Earlie Counts, FNP  HPI: 61 year old female, never smoked.    Previous [facility] pulmonary encounter: She was previously seen by Dr. Mortimer Fries.   She had sleep study in November 2022.  This showed very severe obstructive sleep apnea with significant oxygen desaturation.  She failed CPAP during titration portion of the study do to treatment emergent centrals and persistent obstructive events.  She did better with Bipap.   She was started on auto Bipap with PS 6, min EPAP 10, max IPAP 23 cm H2O.   She is sleeping much better, and has more energy.  She is using a full face mask.  She has trouble with mask irritating the bridge of her nose.  She is also having frequent mask leak.  This will wake her up, and noise will wake her husband up.  Not having sinus congestion, dry mouth, or aerophagia.    10/17/2021 Patient presents today for acute overview due to OSA.  Since decreasing BIPAP pressure settings in February she reports experiencing more fatigue during the day time.  Original pressure settings were 23/10/6 with PS 6; this was changed to 22/6/4 by Dr. Halford Chessman during her last visit d/t mask leaking. Unable to access compliance report from her SD card today, advised she bring into [employer] for download. Epworth score 15 (20 before starting PAP).    No Known Allergies  Immunization History  Administered Date(s) Administered   Influenza, Seasonal, Injecte, Preservative Fre 02/26/2014, 03/16/2019   Influenza,inj,Quad PF,6+ Mos 04/24/2015, 02/27/2016, 03/14/2018   Influenza-Unspecified 05/01/2013, 03/20/2017, 03/08/2020, 03/10/2021   Moderna SARS-COV2 Booster Vaccination 01/20/2021   Moderna Sars-Covid-2 Vaccination 08/19/2019, 09/16/2019, 04/20/2020    Pneumococcal Polysaccharide-23 10/23/2014   Tdap 12/07/2019   Zoster Recombinat (Shingrix) 12/05/2018, 06/23/2019    Past Medical History:  Diagnosis Date   Asthma    Cancer ([place])    esophageal   CKD (chronic kidney disease), stage III ([place])    a. 12/2020 Renal duplex: No RAS.   Eczema    Fatty liver    GERD (gastroesophageal reflux disease)    Heart murmur    History of echocardiogram    a. 12/2020 Echo: EF 55-60%, no rwma, triv MR.   Hypertension    Hypothyroidism    MI (myocardial infarction) ([place])    a. 12/2020 in setting of SCAD (dRCA)-->Med rx.   Multinodular goiter    Sleep apnea    Spontaneous dissection of coronary artery    a. 12/2020 Cath: LM nl, LAD nl, LCX nl, RCA 80d (most consistent w/ SCAD). EF 65%-->Med Rx.    Tobacco History: Social History   Tobacco Use  Smoking Status Never  Smokeless Tobacco Never   Counseling given: Not Answered   Outpatient Medications Prior to Visit  Medication Sig Dispense Refill   albuterol (VENTOLIN HFA) 108 (90 Base) MCG/ACT inhaler Inhale 2 puffs into the lungs every 6 (six) hours as needed for wheezing or shortness of breath.     amLODipine (NORVASC) 5 MG tablet Take 1 tablet (5 mg total) by mouth daily. 90 tablet 0   aspirin 81 MG EC tablet Take 1 tablet (81 mg total) by mouth daily. Swallow whole. 90 tablet 1   atorvastatin (LIPITOR) 40 MG tablet Take  1 tablet (40 mg total) by mouth daily. 90 tablet 1   azelastine (ASTELIN) 0.1 % nasal spray Place 2 sprays into both nostrils 2 (two) times daily. Use in each nostril as directed     Calcium Citrate-Vitamin D (CALCIUM CITRATE + PO) Take by mouth daily in the afternoon.     fluticasone (FLONASE) 50 MCG/ACT nasal spray Place into both nostrils daily.     isosorbide mononitrate (IMDUR) 120 MG 24 hr tablet Take 1 tablet (120 mg total) by mouth daily. 90 tablet 0   levothyroxine (SYNTHROID) 50 MCG tablet Take 50 mcg by mouth daily before breakfast.     metFORMIN (GLUCOPHAGE-XR) 500  MG 24 hr tablet Take 500 mg by mouth daily.     metoprolol succinate (TOPROL-XL) 25 MG 24 hr tablet Take 1 tablet (25 mg total) by mouth daily. 90 tablet 1   nitroGLYCERIN (NITROSTAT) 0.4 MG SL tablet Place 1 tablet (0.4 mg total) under the tongue every 5 (five) minutes x 3 doses as needed for up to 10 doses for chest pain. 10 tablet 0   Semaglutide-Weight Management 0.25 MG/0.5ML SOAJ Inject 0.25 mg into the skin once a week for 28 days. 2 mL 0   [START ON 10/21/2021] Semaglutide-Weight Management 0.5 MG/0.5ML SOAJ Inject 0.5 mg into the skin once a week for 28 days. 2 mL 0   [START ON 11/19/2021] Semaglutide-Weight Management 1 MG/0.5ML SOAJ Inject 1 mg into the skin once a week for 28 days. 2 mL 0   [START ON 12/18/2021] Semaglutide-Weight Management 1.7 MG/0.75ML SOAJ Inject 1.7 mg into the skin once a week for 28 days. 3 mL 0   [START ON 01/16/2022] Semaglutide-Weight Management 2.4 MG/0.75ML SOAJ Inject 2.4 mg into the skin once a week. 3 mL 8   VITAMIN D PO Take by mouth daily.     No facility-administered medications prior to visit.      Review of Systems  Review of Systems  Constitutional:  Positive for fatigue.  HENT: Negative.    Respiratory: Negative.    Cardiovascular: Negative.     Physical Exam  BP 126/76 (BP Location: Left Arm, Cuff Size: Normal)   Pulse 71   Temp 97.7 F (36.5 C) (Temporal)   Ht '5\' 2"'$  (1.575 m)   Wt 206 lb 6.4 oz (93.6 kg)   SpO2 95%   BMI 37.75 kg/m  Physical Exam Constitutional:      Appearance: Normal appearance.  HENT:     Head: Normocephalic and atraumatic.     Mouth/Throat:     Mouth: Mucous membranes are moist.     Pharynx: Oropharynx is clear.  Cardiovascular:     Rate and Rhythm: Normal rate and regular rhythm.  Pulmonary:     Effort: Pulmonary effort is normal.     Breath sounds: Normal breath sounds.  Musculoskeletal:        General: Normal range of motion.  Skin:    General: Skin is warm and dry.  Neurological:      General: No focal deficit present.     Mental Status: She is alert and oriented to person, place, and time. Mental status is at baseline.  Psychiatric:        Mood and Affect: Mood normal.        Behavior: Behavior normal.        Thought Content: Thought content normal.        Judgment: Judgment normal.     Lab Results:  CBC  Component Value Date/Time   WBC 6.0 01/30/2021 0529   RBC 4.64 01/30/2021 0529   HGB 14.0 01/30/2021 0529   HGB 15.3 11/05/2011 1127   HCT 41.9 01/30/2021 0529   HCT 46.3 11/05/2011 1127   PLT 172 01/30/2021 0529   PLT 218 11/05/2011 1127   MCV 90.3 01/30/2021 0529   MCV 88 11/05/2011 1127   MCH 30.2 01/30/2021 0529   MCHC 33.4 01/30/2021 0529   RDW 14.3 01/30/2021 0529   RDW 13.6 11/05/2011 1127   LYMPHSABS 1.4 01/05/2021 0602   LYMPHSABS 1.4 11/05/2011 1127   MONOABS 0.3 01/05/2021 0602   MONOABS 0.5 11/05/2011 1127   EOSABS 0.2 01/05/2021 0602   EOSABS 0.1 11/05/2011 1127   BASOSABS 0.0 01/05/2021 0602   BASOSABS 0.1 11/05/2011 1127    BMET    Component Value Date/Time   NA 140 01/30/2021 0529   NA 142 11/05/2011 1127   K 3.5 01/30/2021 0529   K 3.6 11/05/2011 1127   CL 105 01/30/2021 0529   CL 107 11/05/2011 1127   CO2 30 01/30/2021 0529   CO2 27 11/05/2011 1127   GLUCOSE 96 01/30/2021 0529   GLUCOSE 111 (H) 11/05/2011 1127   BUN 13 01/30/2021 0529   BUN 12 11/05/2011 1127   CREATININE 1.10 (H) 01/30/2021 0529   CREATININE 0.88 11/05/2011 1127   CALCIUM 8.7 (L) 01/30/2021 0529   CALCIUM 8.7 11/05/2011 1127   GFRNONAA 58 (L) 01/30/2021 0529   GFRNONAA >60 11/05/2011 1127   GFRAA >60 11/05/2011 1127    BNP No results found for: BNP  ProBNP No results found for: PROBNP  Imaging: No results found.   Assessment & Plan:   Sleep apnea - Patient reports increased daytime sleepiness after BIPAP pressure settings were lowered to 22/6 in February. Original settings were 23/10/6. Pressure was decreased to mask leakage. Epworth  score today was 15. We were unable to access compliance report from SD card, she will need to bring machine into [employer] for download. For now recommend increasing BIPAP pressure min EPAP 8/max IPAP 23. She will notify us on how she is doing after pressure settings have been changed. May need BIPAP titration if continues to have issues with mask leaks and pressure settings.    Martyn Ehrich, NP 10/17/2021

## 2021-10-17 NOTE — Progress Notes (Signed)
Reviewed and agree with assessment/plan.   Chesley Mires, MD [facility] Pulmonary/Critical Care 10/17/2021, 5:16 PM Pager:  [phone]

## 2021-10-28 NOTE — Telephone Encounter (Signed)
Beth, please review Thanks

## 2021-10-29 ENCOUNTER — Encounter: Payer: Self-pay | Admitting: Cardiology

## 2021-10-31 NOTE — Telephone Encounter (Signed)
Sounds good  [employer]view download 07/26/21-10/23/21 90% compliance > 4 hours Pressure 21/6; PS 6 Airleaks 10.8L/min AHI 15.6

## 2021-11-24 ENCOUNTER — Other Ambulatory Visit: Payer: Self-pay | Admitting: *Deleted

## 2021-11-24 MED ORDER — AMLODIPINE BESYLATE 5 MG PO TABS
5.0000 mg | ORAL_TABLET | Freq: Every day | ORAL | 0 refills | Status: DC
Start: 2021-11-24 — End: 2022-02-17

## 2021-11-24 MED ORDER — ISOSORBIDE MONONITRATE ER 120 MG PO TB24: 120.0000 mg | ORAL_TABLET | Freq: Every day | ORAL | 0 refills | Status: DC

## 2021-12-04 ENCOUNTER — Other Ambulatory Visit: Payer: Self-pay | Admitting: Family Medicine

## 2021-12-04 DIAGNOSIS — Z1231 Encounter for screening mammogram for malignant neoplasm of breast: Secondary | ICD-10-CM

## 2021-12-29 ENCOUNTER — Other Ambulatory Visit: Payer: Self-pay

## 2021-12-29 ENCOUNTER — Encounter: Payer: Self-pay | Admitting: Cardiology

## 2021-12-29 ENCOUNTER — Ambulatory Visit: Payer: BC Managed Care – PPO | Admitting: Cardiology

## 2021-12-29 VITALS — BP 110/80 | HR 58 | Ht 62.0 in | Wt 203.6 lb

## 2021-12-29 DIAGNOSIS — I2510 Atherosclerotic heart disease of native coronary artery without angina pectoris: Secondary | ICD-10-CM

## 2021-12-29 DIAGNOSIS — Z6837 Body mass index (BMI) 37.0-37.9, adult: Secondary | ICD-10-CM

## 2021-12-29 DIAGNOSIS — I2542 Coronary artery dissection: Secondary | ICD-10-CM | POA: Diagnosis not present

## 2021-12-29 DIAGNOSIS — I10 Essential (primary) hypertension: Secondary | ICD-10-CM

## 2021-12-29 MED ORDER — ASPIRIN 81 MG PO TBEC: 81.0000 mg | DELAYED_RELEASE_TABLET | Freq: Every day | ORAL | 3 refills | Status: AC

## 2021-12-29 MED ORDER — ATORVASTATIN CALCIUM 40 MG PO TABS: 40.0000 mg | ORAL_TABLET | Freq: Every day | ORAL | 1 refills | Status: DC

## 2021-12-29 MED ORDER — METOPROLOL SUCCINATE ER 25 MG PO TB24: 25.0000 mg | ORAL_TABLET | Freq: Every day | ORAL | 3 refills | Status: DC

## 2021-12-29 NOTE — Telephone Encounter (Signed)
Refill sent for Metoprolol succ 25 mg

## 2021-12-29 NOTE — Patient Instructions (Signed)
Medication Instructions:  - Your physician recommends that you continue on your current medications as directed. Please refer to the Current Medication list given to you today.  *If you need a refill on your cardiac medications before your next appointment, please call your pharmacy*   Lab Work: - none ordered  If you have labs (blood work) drawn today and your tests are completely normal, you will receive your results only by: [place] (if you have MyChart) OR A paper copy in the mail If you have any lab test that is abnormal or we need to change your treatment, we will call you to review the results.   Testing/Procedures: - none ordered   Follow-Up: At [facility], you and your health needs are our priority.  As part of our continuing mission to provide you with exceptional heart care, we have created designated Provider Care Teams.  These Care Teams include your primary Cardiologist (physician) and Advanced Practice Providers (APPs -  Physician Assistants and Nurse Practitioners) who all work together to provide you with the care you need, when you need it.  We recommend signing up for the patient portal called "MyChart".  Sign up information is provided on this After Visit Summary.  MyChart is used to connect with patients for Virtual Visits (Telemedicine).  Patients are able to view lab/test results, encounter notes, upcoming appointments, etc.  Non-urgent messages can be sent to your provider as well.   To learn more about what you can do with MyChart, go to [email or web address].    Your next appointment:   6 month(s)  The format for your next appointment:   In Person  Provider:   You may see Kate Sable, MD or one of the following Advanced Practice Providers on your designated Care Team:   Murray Hodgkins, NP Christell Faith, PA-C Cadence Kathlen Mody, [place]    Other Instructions N/a  Important Information About Sugar

## 2021-12-29 NOTE — Progress Notes (Signed)
Cardiology Office Note:    Date:  12/29/2021   ID:  Lorraine Jimenez, DOB [birth year], MRN [id number]  PCP:  Margarita Rana, MD   [place] Providers Cardiologist:  Kate Sable, MD     Referring MD: Earlie Counts, FNP   Chief Complaint  Patient presents with   Follow-up    3 month follow up. Patient states that sometimes she may feel a sensation in her chest area, but other than that she has been fine. Meds reviewed with patient.     History of Present Illness:    Lorraine Jimenez is a 61 y.o. 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  Being seen for hypertension, history of scad.  Lorraine Jimenez was previously started to help with weight loss.  Has rare occurrence of chest pain, blood pressure adequately controlled on current medications.  No adverse effects from medications noted.  Compliant with all meds.  Took Wegovy x1 month, lost 7 pounds, Wegovy when out of stock.  She has been able to maintain her weight by eating healthier and watching her calories.  Still waiting for [facility] supply return.  Feels well otherwise.   Prior notes Echo 01/05/2021 showed EF 55 to 60%. Left heart cath 01/06/2021 distal RCA 80%, consistent with SCAD.  Past Medical History:  Diagnosis Date   Asthma    Cancer ([place])    esophageal   CKD (chronic kidney disease), stage III ([place])    a. 12/2020 Renal duplex: No RAS.   Eczema    Fatty liver    GERD (gastroesophageal reflux disease)    Heart murmur    History of echocardiogram    a. 12/2020 Echo: EF 55-60%, no rwma, triv MR.   Hypertension    Hypothyroidism    MI (myocardial infarction) ([place])    a. 12/2020 in setting of SCAD (dRCA)-->Med rx.   Multinodular goiter    Sleep apnea    Spontaneous dissection of coronary artery    a. 12/2020 Cath: LM nl, LAD nl, LCX nl, RCA 80d (most consistent w/ SCAD). EF 65%-->Med Rx.    Past Surgical History:  Procedure Laterality Date   ABDOMINAL HYSTERECTOMY      CESAREAN SECTION     COLONOSCOPY     COLONOSCOPY WITH PROPOFOL N/A 03/15/2020   Procedure: COLONOSCOPY WITH PROPOFOL;  Surgeon: Lesly Rubenstein, MD;  Location: [facility] ENDOSCOPY;  Service: Endoscopy;  Laterality: N/A;   DIAGNOSTIC LAPAROSCOPY     ESOPHAGECTOMY     ESOPHAGOGASTRODUODENOSCOPY (EGD) WITH PROPOFOL N/A 03/15/2020   Procedure: ESOPHAGOGASTRODUODENOSCOPY (EGD) WITH PROPOFOL;  Surgeon: Lesly Rubenstein, MD;  Location: [facility] ENDOSCOPY;  Service: Endoscopy;  Laterality: N/A;   EXPLORATORY LAPAROTOMY     LEFT HEART CATH AND CORONARY ANGIOGRAPHY N/A 01/06/2021   Procedure: LEFT HEART CATH AND CORONARY ANGIOGRAPHY;  Surgeon: Andrez Grime, MD;  Location: [place] CV LAB;  Service: Cardiovascular;  Laterality: N/A;   THYROID LOBECTOMY     TUBAL LIGATION     UPPER GI ENDOSCOPY      Current Medications: Current Meds  Medication Sig   albuterol (VENTOLIN HFA) 108 (90 Base) MCG/ACT inhaler Inhale 2 puffs into the lungs every 6 (six) hours as needed for wheezing or shortness of breath.   amLODipine (NORVASC) 5 MG tablet Take 1 tablet (5 mg total) by mouth daily.   aspirin 81 MG EC tablet Take 1 tablet (81 mg total) by mouth daily. Swallow whole.  atorvastatin (LIPITOR) 40 MG tablet Take 1 tablet (40 mg total) by mouth daily.   azelastine (ASTELIN) 0.1 % nasal spray Place 2 sprays into both nostrils 2 (two) times daily. Use in each nostril as directed   Calcium Citrate-Vitamin D (CALCIUM CITRATE + PO) Take by mouth daily in the afternoon.   fluticasone (FLONASE) 50 MCG/ACT nasal spray Place into both nostrils daily.   isosorbide mononitrate (IMDUR) 120 MG 24 hr tablet Take 1 tablet (120 mg total) by mouth daily.   levothyroxine (SYNTHROID) 50 MCG tablet Take 50 mcg by mouth daily before breakfast.   metFORMIN (GLUCOPHAGE-XR) 500 MG 24 hr tablet Take 500 mg by mouth daily.   metoprolol succinate (TOPROL-XL) 25 MG 24 hr tablet Take 1 tablet (25 mg total) by mouth daily.    nitroGLYCERIN (NITROSTAT) 0.4 MG SL tablet Place 1 tablet (0.4 mg total) under the tongue every 5 (five) minutes x 3 doses as needed for up to 10 doses for chest pain.   VITAMIN D PO Take by mouth daily.     Allergies:   Patient has no known allergies.   Social History   Socioeconomic History   Marital status: Married    Spouse name: Not on file   Number of children: Not on file   Years of education: Not on file   Highest education level: Not on file  Occupational History   Not on file  Tobacco Use   Smoking status: Never   Smokeless tobacco: Never  Vaping Use   Vaping Use: Never used  Substance and Sexual Activity   Alcohol use: Yes    Alcohol/week: 1.0 standard drink of alcohol    Types: 1 Standard drinks or equivalent per week    Comment: once or twice a month, rare   Drug use: Never   Sexual activity: Not on file  Other Topics Concern   Not on file  Social History Narrative   Not on file   Social Determinants of Health   Financial Resource Strain: Not on file  Food Insecurity: Not on file  Transportation Needs: Not on file  Physical Activity: Not on file  Stress: Not on file  Social Connections: Not on file     Family History: The patient's family history includes COPD in her mother; Coronary artery disease in her father; Diabetes Mellitus II in her mother; Heart attack in her father; Heart disease in her father; Hypertension in her mother and sister; Hypothyroidism in her mother; Leukemia in her father; Thyroid disease in her mother and sister. There is no history of Breast cancer.  ROS:   Please see the history of present illness.     All other systems reviewed and are negative.  EKGs/Labs/Other Studies Reviewed:    The following studies were reviewed today:   EKG:  EKG not ordered today.    Recent Labs: 01/05/2021: Magnesium 2.2 01/30/2021: BUN 13; Creatinine, Ser 1.10; Hemoglobin 14.0; Platelets 172; Potassium 3.5; Sodium 140; TSH 1.706  Recent Lipid  Panel    Component Value Date/Time   CHOL 101 01/30/2021 0529   TRIG 70 01/30/2021 0529   HDL 44 01/30/2021 0529   CHOLHDL 2.3 01/30/2021 0529   VLDL 14 01/30/2021 0529   LDLCALC 43 01/30/2021 0529     Risk Assessment/Calculations:          Physical Exam:    VS:  BP 110/80 (BP Location: Right Arm, Patient Position: Sitting, Cuff Size: Normal)   Pulse (!) 58   Ht  $'5\' 2"'V$  (1.575 m)   Wt 203 lb 9.6 oz (92.4 kg)   SpO2 98%   BMI 37.24 kg/m     Wt Readings from Last 3 Encounters:  12/29/21 203 lb 9.6 oz (92.4 kg)  10/17/21 206 lb 6.4 oz (93.6 kg)  09/22/21 210 lb (95.3 kg)     GEN:  Well nourished, well developed in no acute distress HEENT: Normal NECK: No JVD; No carotid bruits CARDIAC: RRR, no murmurs, rubs, gallops RESPIRATORY:  Clear to auscultation without rales, wheezing or rhonchi  ABDOMEN: Soft, non-tender, non-distended MUSCULOSKELETAL:  No edema; No deformity  SKIN: Warm and dry NEUROLOGIC:  Alert and oriented x 3 PSYCHIATRIC:  Normal affect   ASSESSMENT:    1. Spontaneous dissection of coronary artery   2. Coronary artery disease involving native coronary artery of native heart without angina pectoris   3. Primary hypertension   4. BMI 37.0-37.9, adult    PLAN:    In order of problems listed above:  Spontaneous coronary artery dissection in the RCA, 80% distal RCA stenosis..  Denies chest pain.  Continue aspirin, Lipitor, Toprol-XL, Imdur 120 mg daily, amlodipine 5 mg daily for BP benefit.  Exercises up to 50 to 70% maximum predicted heart rate to limit arterial shear stress.   Hypertension, BP controlled.  Continue amlodipine 5 mg daily, Toprol-XL, Imdur 120 mg daily. Obesity, lost about 7 pounds since last visit, continue low-calorie diet, continue Wegovy.  Follow-up in 6 months     Medication Adjustments/Labs and Tests Ordered: Current medicines are reviewed at length with the patient today.  Concerns regarding medicines are outlined above.  No  orders of the defined types were placed in this encounter.    No orders of the defined types were placed in this encounter.    Patient Instructions  Medication Instructions:  - Your physician recommends that you continue on your current medications as directed. Please refer to the Current Medication list given to you today.  *If you need a refill on your cardiac medications before your next appointment, please call your pharmacy*   Lab Work: - none ordered  If you have labs (blood work) drawn today and your tests are completely normal, you will receive your results only by: [place] (if you have MyChart) OR A paper copy in the mail If you have any lab test that is abnormal or we need to change your treatment, we will call you to review the results.   Testing/Procedures: - none ordered   Follow-Up: At [facility], you and your health needs are our priority.  As part of our continuing mission to provide you with exceptional heart care, we have created designated Provider Care Teams.  These Care Teams include your primary Cardiologist (physician) and Advanced Practice Providers (APPs -  Physician Assistants and Nurse Practitioners) who all work together to provide you with the care you need, when you need it.  We recommend signing up for the patient portal called "MyChart".  Sign up information is provided on this After Visit Summary.  MyChart is used to connect with patients for Virtual Visits (Telemedicine).  Patients are able to view lab/test results, encounter notes, upcoming appointments, etc.  Non-urgent messages can be sent to your provider as well.   To learn more about what you can do with MyChart, go to [email or web address].    Your next appointment:   6 month(s)  The format for your next appointment:   In Person  Provider:   You  may see Kate Sable, MD or one of the following Advanced Practice Providers on your designated Care Team:   Murray Hodgkins, NP Christell Faith, PA-C Cadence Kathlen Mody, [place]    Other Instructions N/a  Important Information About Sugar         Signed, Kate Sable, MD  12/29/2021 8:58 AM    [place]

## 2022-01-26 ENCOUNTER — Ambulatory Visit
Admission: RE | Admit: 2022-01-26 | Discharge: 2022-01-26 | Disposition: A | Discharge status: Home / Self Care | Payer: BC Managed Care – PPO | Source: Ambulatory Visit | Attending: Family Medicine | Admitting: Family Medicine

## 2022-01-26 DIAGNOSIS — Z1231 Encounter for screening mammogram for malignant neoplasm of breast: Secondary | ICD-10-CM | POA: Diagnosis present

## 2022-01-29 ENCOUNTER — Ambulatory Visit: Payer: BC Managed Care – PPO | Admitting: Pulmonary Disease

## 2022-01-29 ENCOUNTER — Encounter: Payer: Self-pay | Admitting: Pulmonary Disease

## 2022-01-29 VITALS — BP 110/80 | HR 65 | Temp 97.8°F | Ht 62.0 in | Wt 202.0 lb

## 2022-01-29 DIAGNOSIS — G4733 Obstructive sleep apnea (adult) (pediatric): Secondary | ICD-10-CM | POA: Diagnosis not present

## 2022-01-29 NOTE — Patient Instructions (Signed)
Will have [employer] get you a nasal pillow mask with a chin strap  Follow up in 6 months

## 2022-01-29 NOTE — Progress Notes (Addendum)
[facility]  Chief Complaint  Patient presents with   Follow-up    Past Surgical History:  She  has a past surgical history that includes Cesarean section; Colonoscopy; Esophagectomy; Exploratory laparotomy; Abdominal hysterectomy; Thyroid lobectomy; Tubal ligation; Upper gi endoscopy; Diagnostic laparoscopy; Colonoscopy with propofol (N/A, 03/15/2020); Esophagogastroduodenoscopy (egd) with propofol (N/A, 03/15/2020); and LEFT HEART CATH AND CORONARY ANGIOGRAPHY (N/A, 01/06/2021).  Past Medical History:  Asthma, Esophageal Cancer, CKD 3, Eczema, GERD, Fatty liver, HTN, Hypothyroidism, Multinodular goiter, CAD  Constitutional:  BP 110/80 (BP Location: Left Arm, Patient Position: Sitting, Cuff Size: Normal)   Pulse 65   Temp 97.8 F (36.6 C) (Oral)   Ht '5\' 2"'$  (1.575 m)   Wt 202 lb (91.6 kg)   SpO2 96%   BMI 36.95 kg/m   Brief Summary:  Lorraine Jimenez is a 61 y.o. female with obstructive sleep apnea.      Subjective:   She had more trouble with daytime sleepiness when her Bipap setting was lowered.  She increased the pressure setting and this got better.  She has a full face mask.  She has to make the mask very tight to minimize air leak.  This causes irritation and redness on her nose.    She has hx of esophageal cancer and had esophagectomy.  She has to wait 3 to 4 hours after eating dinner before she can lay flat to sleep.  She gets between 5 to 7 hours sleep  per night.  Physical Exam:   Appearance - well kempt   ENMT - no sinus tenderness, no oral exudate, no LAN, Mallampati 4 airway, no stridor  Respiratory - equal breath sounds bilaterally, no wheezing or rales  CV - s1s2 regular rate and rhythm, no murmurs  Ext - no clubbing, no edema  Skin - no rashes  Psych - normal mood and affect    Pulmonary testing:  PFT 12/06/20 >> FEV1 1.79 (76%), FEV1% 74, TLC 3.98 (86%), DLCO 89%  Sleep Tests:  PSG 04/01/21 >> AHI  95.1, SpO2 low 60%; Bipap 23/17 cm H2O >> AHI 8.5 Auto Bipap 12/27/21 to 01/25/22 >> used on 28 of 30 nights with average 6 hrs 3 min.  Average AHI 22.9 with median Bipap 21/16 and 95 th percentile Bipap 23/18 cm H2O  Cardiac Tests:  Echo 01/05/21 >> EF 55 to 60%  Social History:  She  reports that she has never smoked. She has been exposed to tobacco smoke. She has never used smokeless tobacco. She reports current alcohol use of about 1.0 standard drink of alcohol per week. She reports that she does not use drugs.  Family History:  Her family history includes COPD in her mother; Coronary artery disease in her father; Diabetes Mellitus II in her mother; Heart attack in her father; Heart disease in her father; Hypertension in her mother and sister; Hypothyroidism in her mother; Leukemia in her father; Thyroid disease in her mother and sister.     Assessment/Plan:   Obstructive sleep apnea. - she is compliant with Bipap and reports benefit from therapy - she uses [employer] for her DME - having trouble with mask leak and irritation - will try changing to nasal pillows with chin strap; advised her to monitor for sinus irritation - continue auto Bipap with max IPAP 23, min EPAP 10, PS 5 cm H2O  Obesity. - discussed how her weight can impact her sleep apnea control  Time Spent Involved in Patient Care  on Day of Examination:  27 minutes  Follow up:   Patient Instructions  Will have [employer] get you a nasal pillow mask with a chin strap  Follow up in 6 months  Medication List:   Allergies as of 01/29/2022   No Known Allergies      Medication List        Accurate as of January 29, 2022  9:17 AM. If you have any questions, ask your nurse or doctor.          albuterol 108 (90 Base) MCG/ACT inhaler Commonly known as: VENTOLIN HFA Inhale 2 puffs into the lungs every 6 (six) hours as needed for wheezing or shortness of breath.   amLODipine 5 MG tablet Commonly known as: NORVASC Take  1 tablet (5 mg total) by mouth daily.   aspirin EC 81 MG tablet Take 1 tablet (81 mg total) by mouth daily. Swallow whole.   atorvastatin 40 MG tablet Commonly known as: LIPITOR Take 1 tablet (40 mg total) by mouth daily.   azelastine 0.1 % nasal spray Commonly known as: ASTELIN Place 2 sprays into both nostrils 2 (two) times daily. Use in each nostril as directed   CALCIUM CITRATE + PO Take by mouth daily in the afternoon.   fluticasone 50 MCG/ACT nasal spray Commonly known as: FLONASE Place into both nostrils daily.   isosorbide mononitrate 120 MG 24 hr tablet Commonly known as: IMDUR Take 1 tablet (120 mg total) by mouth daily.   levothyroxine 50 MCG tablet Commonly known as: SYNTHROID Take 50 mcg by mouth daily before breakfast.   metFORMIN 500 MG 24 hr tablet Commonly known as: GLUCOPHAGE-XR Take 500 mg by mouth daily.   metoprolol succinate 25 MG 24 hr tablet Commonly known as: TOPROL-XL Take 1 tablet (25 mg total) by mouth daily.   nitroGLYCERIN 0.4 MG SL tablet Commonly known as: NITROSTAT Place 1 tablet (0.4 mg total) under the tongue every 5 (five) minutes x 3 doses as needed for up to 10 doses for chest pain.   Semaglutide-Weight Management 2.4 MG/0.75ML Soaj Inject 2.4 mg into the skin once a week.   VITAMIN D PO Take by mouth daily.        Signature:  Chesley Mires, MD [place] Pager - [phone] 01/29/2022, 9:17 AM

## 2022-02-17 ENCOUNTER — Other Ambulatory Visit: Payer: Self-pay

## 2022-02-17 MED ORDER — AMLODIPINE BESYLATE 5 MG PO TABS: 5.0000 mg | ORAL_TABLET | Freq: Every day | ORAL | 1 refills | Status: DC

## 2022-02-17 MED ORDER — ISOSORBIDE MONONITRATE ER 120 MG PO TB24: 120.0000 mg | ORAL_TABLET | Freq: Every day | ORAL | 1 refills | Status: DC

## 2022-04-03 ENCOUNTER — Ambulatory Visit: Payer: Self-pay

## 2022-05-15 ENCOUNTER — Other Ambulatory Visit: Payer: Self-pay | Admitting: *Deleted

## 2022-05-15 MED ORDER — ISOSORBIDE MONONITRATE ER 120 MG PO TB24: 120.0000 mg | ORAL_TABLET | Freq: Every day | ORAL | 0 refills | Status: DC

## 2022-05-15 MED ORDER — AMLODIPINE BESYLATE 5 MG PO TABS: 5.0000 mg | ORAL_TABLET | Freq: Every day | ORAL | 0 refills | Status: DC

## 2022-06-23 ENCOUNTER — Other Ambulatory Visit: Payer: Self-pay

## 2022-06-23 MED ORDER — ATORVASTATIN CALCIUM 40 MG PO TABS: 40.0000 mg | ORAL_TABLET | Freq: Every day | ORAL | 0 refills | Status: DC

## 2022-07-02 ENCOUNTER — Ambulatory Visit: Payer: No Typology Code available for payment source | Attending: Cardiology | Admitting: Cardiology

## 2022-07-02 ENCOUNTER — Other Ambulatory Visit: Payer: Self-pay

## 2022-07-02 ENCOUNTER — Encounter: Payer: Self-pay | Admitting: Cardiology

## 2022-07-02 VITALS — BP 124/74 | HR 56 | Ht 62.0 in | Wt 206.0 lb

## 2022-07-02 DIAGNOSIS — I10 Essential (primary) hypertension: Secondary | ICD-10-CM | POA: Diagnosis not present

## 2022-07-02 DIAGNOSIS — I2542 Coronary artery dissection: Secondary | ICD-10-CM

## 2022-07-02 DIAGNOSIS — Z6837 Body mass index (BMI) 37.0-37.9, adult: Secondary | ICD-10-CM | POA: Diagnosis not present

## 2022-07-02 MED ORDER — SEMAGLUTIDE-WEIGHT MANAGEMENT 1 MG/0.5ML ~~LOC~~ SOAJ
1.0000 mg | SUBCUTANEOUS | 0 refills | Status: AC
  Filled 2022-07-02 – 2022-08-30 (×2): qty 2, 28d supply, fill #0

## 2022-07-02 MED ORDER — SEMAGLUTIDE-WEIGHT MANAGEMENT 2.4 MG/0.75ML ~~LOC~~ SOAJ
2.4000 mg | SUBCUTANEOUS | 3 refills | Status: AC
  Filled 2022-07-02: qty 3, fill #0
  Filled 2022-10-27: qty 3, 28d supply, fill #0
  Filled 2022-11-20: qty 3, 28d supply, fill #1
  Filled 2022-12-14: qty 3, 28d supply, fill #2
  Filled 2023-01-11: qty 3, 28d supply, fill #3

## 2022-07-02 MED ORDER — SEMAGLUTIDE-WEIGHT MANAGEMENT 0.5 MG/0.5ML ~~LOC~~ SOAJ
0.5000 mg | SUBCUTANEOUS | 0 refills | Status: AC
  Filled 2022-07-02 – 2022-07-31 (×2): qty 2, 28d supply, fill #0

## 2022-07-02 MED ORDER — SEMAGLUTIDE-WEIGHT MANAGEMENT 1.7 MG/0.75ML ~~LOC~~ SOAJ
1.7000 mg | SUBCUTANEOUS | 0 refills | Status: AC
  Filled 2022-07-02 – 2022-09-27 (×2): qty 3, 28d supply, fill #0

## 2022-07-02 MED ORDER — SEMAGLUTIDE-WEIGHT MANAGEMENT 2.4 MG/0.75ML ~~LOC~~ SOAJ
2.4000 mg | SUBCUTANEOUS | 8 refills | Status: DC
  Filled 2022-07-02: qty 3, fill #0

## 2022-07-02 MED ORDER — SEMAGLUTIDE-WEIGHT MANAGEMENT 0.25 MG/0.5ML ~~LOC~~ SOAJ
0.2500 mg | SUBCUTANEOUS | 0 refills | Status: AC
  Filled 2022-07-02 – 2022-07-13 (×2): qty 2, 28d supply, fill #0

## 2022-07-02 NOTE — Patient Instructions (Signed)
Medication Instructions:   Your physician recommends that you continue on your current medications as directed. Please refer to the Current Medication list given to you today.  *If you need a refill on your cardiac medications before your next appointment, please call your pharmacy*   Lab Work:  None Ordered  If you have labs (blood work) drawn today and your tests are completely normal, you will receive your results only by: MyChart Message (if you have MyChart) OR A paper copy in the mail If you have any lab test that is abnormal or we need to change your treatment, we will call you to review the results.   Testing/Procedures:  None Ordered   Follow-Up: At [facility], you and your health needs are our priority.  As part of our continuing mission to provide you with exceptional heart care, we have created designated Provider Care Teams.  These Care Teams include your primary Cardiologist (physician) and Advanced Practice Providers (APPs -  Physician Assistants and Nurse Practitioners) who all work together to provide you with the care you need, when you need it.  We recommend signing up for the patient portal called "MyChart".  Sign up information is provided on this After Visit Summary.  MyChart is used to connect with patients for Virtual Visits (Telemedicine).  Patients are able to view lab/test results, encounter notes, upcoming appointments, etc.  Non-urgent messages can be sent to your provider as well.   To learn more about what you can do with MyChart, go to [email or web address].    Your next appointment:   12 month(s)  Provider:   You may see Brian Agbor-Etang, MD or one of the following Advanced Practice Providers on your designated Care Team:   Christopher Berge, NP Ryan Dunn, PA-C Cadence Furth, PA-C Sheri Hammock, NP  

## 2022-07-02 NOTE — Progress Notes (Signed)
Cardiology Office Note:    Date:  07/02/2022   ID:  Lorraine Jimenez, DOB [birth year], MRN [id number]  PCP:  Margarita Rana, MD   [facility] Providers Cardiologist:  Kate Sable, MD     Referring MD: Margarita Rana, MD   Chief Complaint  Patient presents with   Follow-up    6 month f/u, no new cardiac concerns     History of Present Illness:    Lorraine Jimenez is a 62 y.o. 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  Feels well, denies chest pain or shortness of breath.  Previously started on Wegovy for weight loss, initially lost 7 pounds, Mancel Parsons currently out of supply.  Tolerating all medications as prescribed.  Has no new concerns at this time.  Prior notes Echo 01/05/2021 showed EF 55 to 60%. Left heart cath 01/06/2021 distal RCA 80%, consistent with SCAD.  Past Medical History:  Diagnosis Date   Asthma    Cancer ([place])    esophageal   CKD (chronic kidney disease), stage III ([place])    a. 12/2020 Renal duplex: No RAS.   Eczema    Fatty liver    GERD (gastroesophageal reflux disease)    Heart murmur    History of echocardiogram    a. 12/2020 Echo: EF 55-60%, no rwma, triv MR.   Hypertension    Hypothyroidism    MI (myocardial infarction) ([place])    a. 12/2020 in setting of SCAD (dRCA)-->Med rx.   Multinodular goiter    Sleep apnea    Spontaneous dissection of coronary artery    a. 12/2020 Cath: LM nl, LAD nl, LCX nl, RCA 80d (most consistent w/ SCAD). EF 65%-->Med Rx.    Past Surgical History:  Procedure Laterality Date   ABDOMINAL HYSTERECTOMY     CESAREAN SECTION     COLONOSCOPY     COLONOSCOPY WITH PROPOFOL N/A 03/15/2020   Procedure: COLONOSCOPY WITH PROPOFOL;  Surgeon: Lesly Rubenstein, MD;  Location: [facility] ENDOSCOPY;  Service: Endoscopy;  Laterality: N/A;   DIAGNOSTIC LAPAROSCOPY     ESOPHAGECTOMY     ESOPHAGOGASTRODUODENOSCOPY (EGD) WITH PROPOFOL N/A 03/15/2020   Procedure:  ESOPHAGOGASTRODUODENOSCOPY (EGD) WITH PROPOFOL;  Surgeon: Lesly Rubenstein, MD;  Location: [facility] END[facility]COPY;  Service: Endoscopy;  Laterality: N/A;   EXPLORATORY LAPAROTOMY     LEFT HEART CATH AND CORONARY ANGIOGRAPHY N/A 01/06/2021   Procedure: LEFT HEART CATH AND CORONARY ANGIOGRAPHY;  Surgeon: Andrez Grime, MD;  Location: [facility];  Service: Cardiovascular;  Laterality: N/A;   THYROID LOBECTOMY     TUBAL LIGATION     UPPER GI ENDOSCOPY      Current Medications: Current Meds  Medication Sig   albuterol (VENTOLIN HFA) 108 (90 Base) MCG/ACT inhaler Inhale 2 puffs into the lungs every 6 (six) hours as needed for wheezing or shortness of breath.   amLODipine (NORVASC) 5 MG tablet Take 1 tablet (5 mg total) by mouth daily.   aspirin EC 81 MG tablet Take 1 tablet (81 mg total) by mouth daily. Swallow whole.   atorvastatin (LIPITOR) 40 MG tablet Take 1 tablet (40 mg total) by mouth daily.   azelastine (ASTELIN) 0.1 % nasal spray Place 2 sprays into both nostrils 2 (two) times daily. Use in each nostril as directed   Calcium Citrate-Vitamin D (CALCIUM CITRATE + PO) Take by mouth daily in the afternoon.   fluticasone (FLONASE) 50 MCG/ACT nasal spray Place into  both nostrils daily.   isosorbide mononitrate (IMDUR) 120 MG 24 hr tablet Take 1 tablet (120 mg total) by mouth daily.   levothyroxine (SYNTHROID) 50 MCG tablet Take 50 mcg by mouth daily before breakfast.   metFORMIN (GLUCOPHAGE-XR) 500 MG 24 hr tablet Take 500 mg by mouth daily.   metoprolol succinate (TOPROL-XL) 25 MG 24 hr tablet Take 1 tablet (25 mg total) by mouth daily.   nitroGLYCERIN (NITROSTAT) 0.4 MG SL tablet Place 1 tablet (0.4 mg total) under the tongue every 5 (five) minutes x 3 doses as needed for up to 10 doses for chest pain.   Semaglutide-Weight Management 0.25 MG/0.5ML SOAJ Inject 0.25 mg into the skin once a week for 28 days.   [START ON 07/31/2022] Semaglutide-Weight Management 0.5 MG/0.5ML SOAJ Inject  0.5 mg into the skin once a week for 28 days.   [START ON 08/29/2022] Semaglutide-Weight Management 1 MG/0.5ML SOAJ Inject 1 mg into the skin once a week for 28 days.   [START ON 09/27/2022] Semaglutide-Weight Management 1.7 MG/0.75ML SOAJ Inject 1.7 mg into the skin once a week for 28 days.   [START ON 10/26/2022] Semaglutide-Weight Management 2.4 MG/0.75ML SOAJ Inject 2.4 mg into the skin once a week.   VITAMIN D PO Take by mouth daily.   [DISCONTINUED] Semaglutide-Weight Management 2.4 MG/0.75ML SOAJ Inject 2.4 mg into the skin once a week.     Allergies:   Patient has no known allergies.   Social History   Socioeconomic History   Marital status: Married    Spouse name: Not on file   Number of children: Not on file   Years of education: Not on file   Highest education level: Not on file  Occupational History   Not on file  Tobacco Use   Smoking status: Never    Passive exposure: Past   Smokeless tobacco: Never   Tobacco comments:    2nd hand exposure as child   Vaping Use   Vaping Use: Never used  Substance and Sexual Activity   Alcohol use: Yes    Alcohol/week: 1.0 standard drink of alcohol    Types: 1 Standard drinks or equivalent per week    Comment: once or twice a month, rare   Drug use: Never   Sexual activity: Not on file  Other Topics Concern   Not on file  Social History Narrative   Not on file   Social Determinants of Health   Financial Resource Strain: Not on file  Food Insecurity: Not on file  Transportation Needs: Not on file  Physical Activity: Not on file  Stress: Not on file  Social Connections: Not on file     Family History: The patient's family history includes COPD in her mother; Coronary artery disease in her father; Diabetes Mellitus II in her mother; Heart attack in her father; Heart disease in her father; Hypertension in her mother and sister; Hypothyroidism in her mother; Leukemia in her father; Thyroid disease in her mother and sister.  There is no history of Breast cancer.  ROS:   Please see the history of present illness.     All other systems reviewed and are negative.  EKGs/Labs/Other Studies Reviewed:    The following studies were reviewed today:   EKG:  EKG not ordered today.    Recent Labs: No results found for requested labs within last 365 days.  Recent Lipid Panel    Component Value Date/Time   CHOL 101 01/30/2021 0529   TRIG 70 01/30/2021  0529   HDL 44 01/30/2021 0529   CHOLHDL 2.3 01/30/2021 0529   VLDL 14 01/30/2021 0529   LDLCALC 43 01/30/2021 0529     Risk Assessment/Calculations:          Physical Exam:    VS:  BP 124/74 (BP Location: Left Arm, Patient Position: Sitting, Cuff Size: Normal)   Pulse (!) 56   Ht '5\' 2"'$  (1.575 m)   Wt 206 lb (93.4 kg)   SpO2 98%   BMI 37.68 kg/m     Wt Readings from Last 3 Encounters:  07/02/22 206 lb (93.4 kg)  01/29/22 202 lb (91.6 kg)  12/29/21 203 lb 9.6 oz (92.4 kg)     GEN:  Well nourished, well developed in no acute distress HEENT: Normal NECK: No JVD; No carotid bruits CARDIAC: RRR, no murmurs, rubs, gallops RESPIRATORY:  Clear to auscultation without rales, wheezing or rhonchi  ABDOMEN: Soft, non-tender, non-distended MUSCULOSKELETAL:  No edema; No deformity  SKIN: Warm and dry NEUROLOGIC:  Alert and oriented x 3 PSYCHIATRIC:  Normal affect   ASSESSMENT:    1. Spontaneous dissection of coronary artery   2. Primary hypertension   3. BMI 37.0-37.9, adult    PLAN:    In order of problems listed above:  Spontaneous coronary artery dissection in the RCA, 80% distal RCA stenosis..  Denies chest pain.  Continue aspirin, Lipitor, Toprol-XL, Imdur 120 mg daily, amlodipine 5 mg daily.  LDL at goal.  Exercises up to 50 to 70% maximum predicted heart rate to limit arterial shear stress.   Hypertension, BP controlled.  Continue amlodipine 5 mg daily, Toprol-XL, Imdur 120 mg daily. Obesity, continue low-calorie diet, exercising,  Wegovy.  Follow-up in 1 year.    Medication Adjustments/Labs and Tests Ordered: Current medicines are reviewed at length with the patient today.  Concerns regarding medicines are outlined above.  Orders Placed This Encounter  Procedures   EKG 12-Lead     Meds ordered this encounter  Medications   DISCONTD: Semaglutide-Weight Management 2.4 MG/0.75ML SOAJ    Sig: Inject 2.4 mg into the skin once a week.    Dispense:  3 mL    Refill:  8   Semaglutide-Weight Management 0.25 MG/0.5ML SOAJ    Sig: Inject 0.25 mg into the skin once a week for 28 days.    Dispense:  2 mL    Refill:  0   Semaglutide-Weight Management 0.5 MG/0.5ML SOAJ    Sig: Inject 0.5 mg into the skin once a week for 28 days.    Dispense:  2 mL    Refill:  0   Semaglutide-Weight Management 1 MG/0.5ML SOAJ    Sig: Inject 1 mg into the skin once a week for 28 days.    Dispense:  2 mL    Refill:  0   Semaglutide-Weight Management 1.7 MG/0.75ML SOAJ    Sig: Inject 1.7 mg into the skin once a week for 28 days.    Dispense:  3 mL    Refill:  0   Semaglutide-Weight Management 2.4 MG/0.75ML SOAJ    Sig: Inject 2.4 mg into the skin once a week.    Dispense:  3 mL    Refill:  3     Patient Instructions  Medication Instructions:   Your physician recommends that you continue on your current medications as directed. Please refer to the Current Medication list given to you today.  *If you need a refill on your cardiac medications before your next appointment,  please call your pharmacy*   Lab Work:  None Ordered  If you have labs (blood work) drawn today and your tests are completely normal, you will receive your results only by: [place] (if you have MyChart) OR A paper copy in the mail If you have any lab test that is abnormal or we need to change your treatment, we will call you to review the results.   Testing/Procedures:  None Ordered   Follow-Up: At [facility], you and your health  needs are our priority.  As part of our continuing mission to provide you with exceptional heart care, we have created designated Provider Care Teams.  These Care Teams include your primary Cardiologist (physician) and Advanced Practice Providers (APPs -  Physician Assistants and Nurse Practitioners) who all work together to provide you with the care you need, when you need it.  We recommend signing up for the patient portal called "MyChart".  Sign up information is provided on this After Visit Summary.  MyChart is used to connect with patients for Virtual Visits (Telemedicine).  Patients are able to view lab/test results, encounter notes, upcoming appointments, etc.  Non-urgent messages can be sent to your provider as well.   To learn more about what you can do with MyChart, go to [email or web address].    Your next appointment:   12 month(s)  Provider:   You may see Kate Sable, MD or one of the following Advanced Practice Providers on your designated Care Team:   Murray Hodgkins, NP Christell Faith, PA-C Cadence Kathlen Mody, PA-C Gerrie Nordmann, NP    Signed, Kate Sable, MD  07/02/2022 9:24 AM    [place]

## 2022-07-03 ENCOUNTER — Telehealth: Payer: Self-pay | Admitting: *Deleted

## 2022-07-03 ENCOUNTER — Other Ambulatory Visit: Payer: Self-pay

## 2022-07-03 NOTE — Telephone Encounter (Signed)
Pt requiring PA for wegovy per Covermymeds. PA has been submitted via covermymeds. Awaiting approval.  Clinical Questions Are Ready Fill out the questions below and click "Send to Plan." The plan requires answers to the clinical questions for this electronic prior authorization.

## 2022-07-03 NOTE — Telephone Encounter (Signed)
Received a fax from [place] stating that the prior authorization request for [facility] .'25mg'$ /mL was not approved.

## 2022-07-12 ENCOUNTER — Encounter: Payer: Self-pay | Admitting: Cardiology

## 2022-07-13 ENCOUNTER — Other Ambulatory Visit: Payer: Self-pay

## 2022-07-13 NOTE — Telephone Encounter (Signed)
Resent PA through Covermymeds with new information.   Lavetta Ethier (Key: [id number])  form thumbnail Your information has been submitted to [place]. To check for an updated outcome later, reopen this PA request from your dashboard.  If [employer] has not responded to your request within 24 hours, contact [place] at [phone]. If you think there may be a problem with your PA request, use our live chat feature at the bottom right.

## 2022-07-14 ENCOUNTER — Other Ambulatory Visit: Payer: Self-pay

## 2022-07-14 NOTE — Telephone Encounter (Signed)
Received fax from [place] stating that  the request for coverage of Wegovy .25 mg/0.8m has been approved. The request has been approved from 07/13/2022 to 02/11/2023.

## 2022-07-24 ENCOUNTER — Other Ambulatory Visit: Payer: Self-pay

## 2022-08-02 ENCOUNTER — Other Ambulatory Visit: Payer: Self-pay

## 2022-08-18 ENCOUNTER — Telehealth: Payer: Self-pay | Admitting: Cardiology

## 2022-08-18 NOTE — Telephone Encounter (Signed)
   Pre-operative Risk Assessment    Patient Name: Lorraine Jimenez  DOB: Oct 04, 1960 MRN: [id number]      Request for Surgical Clearance    Procedure:   [facility] Arthroplasty   Date of Surgery:  Clearance 08/24/22                                 Surgeon:  Dr. Iran Planas Surgeon's Group or Practice Name:  [facility] Phone number:  [phone] Fax number:  [phone]   Type of Clearance Requested:   - Pharmacy:  Hold Aspirin TBD by cardiologist   Type of Anesthesia:   Regional w/IV sedation   Additional requests/questions:  Please fax a copy of medical clearance to the surgeon's office.  Lorraine Jimenez   08/18/2022, 2:12 PM

## 2022-08-18 NOTE — Telephone Encounter (Signed)
Dr. Garen Lah,   [place] saw this patient on 07/02/2022. Will you please comment on clearance for [facility] arthoplasty scheduled for 08/24/2022?  Please route your response to P CV DIV Preop. I will communicate with requesting office once you have given recommendations.   Thank you!  Mayra Reel, NP

## 2022-08-20 NOTE — Telephone Encounter (Signed)
   Patient Name: Lorraine Jimenez  DOB: 02/13/1961 MRN: [id number]  Primary Cardiologist: Kate Sable, MD  Chart reviewed as part of pre-operative protocol coverage. Pre-op clearance already addressed by colleagues in earlier phone notes. To summarize recommendations:  - Okay for procedure from a cardiac perspective, okay to hold aspirin.  Restart aspirin as soon as safely possible after procedure.  -Dr. Garen Lah  Will route this bundled recommendation to requesting provider via Epic fax function and remove from pre-op pool. Please call with questions.  Elgie Collard, PA-C 08/20/2022, 4:15 PM

## 2022-08-30 ENCOUNTER — Other Ambulatory Visit: Payer: Self-pay

## 2022-09-15 ENCOUNTER — Encounter: Payer: Self-pay | Admitting: Pulmonary Disease

## 2022-09-15 ENCOUNTER — Ambulatory Visit (INDEPENDENT_AMBULATORY_CARE_PROVIDER_SITE_OTHER): Payer: No Typology Code available for payment source | Admitting: Pulmonary Disease

## 2022-09-15 VITALS — BP 118/68 | HR 85 | Temp 97.8°F | Ht 62.0 in | Wt 199.4 lb

## 2022-09-15 DIAGNOSIS — J4520 Mild intermittent asthma, uncomplicated: Secondary | ICD-10-CM | POA: Diagnosis not present

## 2022-09-15 DIAGNOSIS — G4733 Obstructive sleep apnea (adult) (pediatric): Secondary | ICD-10-CM

## 2022-09-15 MED ORDER — ALBUTEROL SULFATE HFA 108 (90 BASE) MCG/ACT IN AERS: 2.0000 | INHALATION_SPRAY | Freq: Four times a day (QID) | RESPIRATORY_TRACT | 3 refills | Status: AC | PRN

## 2022-09-15 NOTE — Patient Instructions (Signed)
Will have [employer] arrange for a small size Resmed N20 CPAP mask  Follow up in 1 year

## 2022-09-15 NOTE — Progress Notes (Signed)
Lorraine Jimenez, Critical Care, and Sleep Medicine  Chief Complaint  Patient presents with   Follow-up    Wearing bipap nightly- pressure and mask(n20) is okay.     Past Surgical History:  She  has a past surgical history that includes Cesarean section; Colonoscopy; Esophagectomy; Exploratory laparotomy; Abdominal hysterectomy; Thyroid lobectomy; Tubal ligation; Upper gi endoscopy; Diagnostic laparoscopy; Colonoscopy with propofol (N/A, 03/15/2020); Esophagogastroduodenoscopy (egd) with propofol (N/A, 03/15/2020); and LEFT HEART CATH AND CORONARY ANGIOGRAPHY (N/A, 01/06/2021).  Past Medical History:  Asthma, Esophageal Cancer, CKD 3, Eczema, GERD, Fatty liver, HTN, Hypothyroidism, Multinodular goiter, CAD  Constitutional:  BP 118/68 (BP Location: Right Arm, Cuff Size: Normal)   Pulse 85   Temp 97.8 F (36.6 C) (Temporal)   Ht  (1.575 m)   Wt 199 lb 6.4 oz (90.4 kg)   SpO2 96%   BMI 36.47 kg/m   Brief Summary:  Lorraine Jimenez is a 62 y.o. female with obstructive sleep apnea and asthma.      Subjective:   Uses Bipap nightly.  She tried a Resmed N20 mask a few weeks ago and doing much better.  No longer having air leak.  Still has some mouth dryness but not as often.  Feels rested during the day.  Her asthma is stable as long as she avoid triggers >> strong smells and pollen.  Hasn't needed to use albuterol much but her current inhaler is expired.  She was previously on allergy shots.  She had surgery on her Lt hand and has to be in a cast for another 2 to 3 weeks.  Physical Exam:   Appearance - well kempt   ENMT - no sinus tenderness, no oral exudate, no LAN, Mallampati 3 airway, no stridor  Respiratory - equal breath sounds bilaterally, no wheezing or rales  CV - s1s2 regular rate and rhythm, no murmurs  Ext - no clubbing, no edema  Skin - no rashes  Psych - normal mood and affect     Jimenez testing:  PFT 12/06/20 >> FEV1 1.79 (76%), FEV1% 74,  TLC 3.98 (86%), DLCO 89%  Sleep Tests:  PSG 04/01/21 >> AHI 95.1, SpO2 low 60%; Bipap 23/17 cm H2O >> AHI 8.5 Auto Bipap 08/16/22 to 09/14/22 >> used on 30 of 30 nights with average 6 hrs 42 min.  Average AHI 3.1 with median Bipap 15/10 and 95 th percentile Bipap 17/12 cm H2O  Cardiac Tests:  Echo 01/05/21 >> EF 55 to 60%  Social History:  She  reports that she has never smoked. She has been exposed to tobacco smoke. She has never used smokeless tobacco. She reports current alcohol use of about 1.0 standard drink of alcohol per week. She reports that she does not use drugs.  Family History:  Her family history includes COPD in her mother; Coronary artery disease in her father; Diabetes Mellitus II in her mother; Heart attack in her father; Heart disease in her father; Hypertension in her mother and sister; Hypothyroidism in her mother; Leukemia in her father; Thyroid disease in her mother and sister.     Assessment/Plan:   Obstructive sleep apnea. - she is compliant with Bipap and reports benefit from therapy - she uses [employer] for her DME - current Bipap ordered 04/09/21 - continue auto Bipap with max IPAP 23, min EPAP 7, PS 5 cm H2O - will arrange for her to get Resmed small size N20 masks  Allergic asthma. - continue azelastine, fluticasone nasal sprays - prn albuterol  Obesity. - discussed how her weight can impact her sleep apnea control  Time Spent Involved in Patient Care on Day of Examination:  28 minutes  Follow up:   Patient Instructions  Will have [employer] arrange for a small size Resmed N20 CPAP mask  Follow up in 1 year  Medication List:   Allergies as of 09/15/2022   No Known Allergies      Medication List        Accurate as of September 15, 2022  4:13 PM. If you have any questions, ask your nurse or doctor.          albuterol 108 (90 Base) MCG/ACT inhaler Commonly known as: VENTOLIN HFA Inhale 2 puffs into the lungs every 6 (six) hours as needed for  wheezing or shortness of breath.   amLODipine 5 MG tablet Commonly known as: NORVASC Take 1 tablet (5 mg total) by mouth daily.   aspirin EC 81 MG tablet Take 1 tablet (81 mg total) by mouth daily. Swallow whole.   atorvastatin 40 MG tablet Commonly known as: LIPITOR Take 1 tablet (40 mg total) by mouth daily.   azelastine 0.1 % nasal spray Commonly known as: ASTELIN Place 2 sprays into both nostrils 2 (two) times daily. Use in each nostril as directed   CALCIUM CITRATE + PO Take by mouth daily in the afternoon.   fluticasone 50 MCG/ACT nasal spray Commonly known as: FLONASE Place into both nostrils daily.   isosorbide mononitrate 120 MG 24 hr tablet Commonly known as: IMDUR Take 1 tablet (120 mg total) by mouth daily.   levothyroxine 50 MCG tablet Commonly known as: SYNTHROID Take 50 mcg by mouth daily before breakfast.   metFORMIN 500 MG 24 hr tablet Commonly known as: GLUCOPHAGE-XR Take 500 mg by mouth daily.   metoprolol succinate 25 MG 24 hr tablet Commonly known as: TOPROL-XL Take 1 tablet (25 mg total) by mouth daily.   nitroGLYCERIN 0.4 MG SL tablet Commonly known as: NITROSTAT Place 1 tablet (0.4 mg total) under the tongue every 5 (five) minutes x 3 doses as needed for up to 10 doses for chest pain.   VITAMIN D PO Take by mouth daily.   Wegovy 1 MG/0.5ML Soaj Generic drug: Semaglutide-Weight Management Inject 1 mg into the skin once a week for 28 days.   Semaglutide-Weight Management 1.7 MG/0.75ML Soaj Inject 1.7 mg into the skin once a week for 28 days. Start taking on: September 27, 2022   Semaglutide-Weight Management 2.4 MG/0.75ML Soaj Inject 2.4 mg into the skin once a week. Start taking on: Oct 26, 2022        Signature:  Coralyn Helling, MD [facility] Jimenez/Critical Care Pager - [phone] 09/15/2022, 4:13 PM

## 2022-09-21 ENCOUNTER — Other Ambulatory Visit: Payer: Self-pay

## 2022-09-21 MED ORDER — ATORVASTATIN CALCIUM 40 MG PO TABS: 40.0000 mg | ORAL_TABLET | Freq: Every day | ORAL | 2 refills | Status: DC

## 2022-09-27 ENCOUNTER — Other Ambulatory Visit: Payer: Self-pay

## 2022-10-27 ENCOUNTER — Encounter (HOSPITAL_BASED_OUTPATIENT_CLINIC_OR_DEPARTMENT_OTHER): Payer: Self-pay | Admitting: Pulmonary Disease

## 2022-10-27 ENCOUNTER — Other Ambulatory Visit: Payer: Self-pay

## 2022-11-18 ENCOUNTER — Other Ambulatory Visit: Payer: Self-pay

## 2022-11-18 MED ORDER — ISOSORBIDE MONONITRATE ER 120 MG PO TB24: 120.0000 mg | ORAL_TABLET | Freq: Every day | ORAL | 2 refills | Status: DC

## 2022-11-18 MED ORDER — AMLODIPINE BESYLATE 5 MG PO TABS: 5.0000 mg | ORAL_TABLET | Freq: Every day | ORAL | 2 refills | Status: DC

## 2022-11-18 NOTE — Telephone Encounter (Signed)
Requested Prescriptions   Signed Prescriptions Disp Refills   isosorbide mononitrate (IMDUR) 120 MG 24 hr tablet 90 tablet 2    Sig: Take 1 tablet (120 mg total) by mouth daily.    Authorizing Provider: Debbe Odea    Ordering User: Feliberto Harts L   amLODipine (NORVASC) 5 MG tablet 90 tablet 2    Sig: Take 1 tablet (5 mg total) by mouth daily.    Authorizing Provider: Debbe Odea    Ordering User: Guerry Minors

## 2022-12-18 ENCOUNTER — Encounter: Payer: Self-pay | Admitting: Family Medicine

## 2022-12-21 ENCOUNTER — Telehealth: Payer: Self-pay

## 2022-12-21 MED ORDER — METOPROLOL SUCCINATE ER 25 MG PO TB24: 25.0000 mg | ORAL_TABLET | Freq: Every day | ORAL | 2 refills | Status: DC

## 2022-12-21 NOTE — Telephone Encounter (Signed)
Requested Prescriptions   Signed Prescriptions Disp Refills   metoprolol succinate (TOPROL XL) 25 MG 24 hr tablet 90 tablet 2    Sig: Take 1 tablet (25 mg total) by mouth daily.    Authorizing Provider: Debbe Odea    Ordering User: Guerry Minors  ]

## 2022-12-23 ENCOUNTER — Other Ambulatory Visit: Payer: Self-pay | Admitting: Family Medicine

## 2022-12-23 DIAGNOSIS — Z1231 Encounter for screening mammogram for malignant neoplasm of breast: Secondary | ICD-10-CM

## 2023-02-05 ENCOUNTER — Ambulatory Visit
Admission: RE | Admit: 2023-02-05 | Discharge: 2023-02-05 | Disposition: A | Discharge status: Home / Self Care | Payer: No Typology Code available for payment source | Source: Ambulatory Visit | Attending: Family Medicine | Admitting: Family Medicine

## 2023-02-05 DIAGNOSIS — Z1231 Encounter for screening mammogram for malignant neoplasm of breast: Secondary | ICD-10-CM | POA: Diagnosis present

## 2023-02-08 ENCOUNTER — Other Ambulatory Visit: Payer: Self-pay

## 2023-02-16 ENCOUNTER — Other Ambulatory Visit: Payer: Self-pay

## 2023-02-17 ENCOUNTER — Other Ambulatory Visit: Payer: Self-pay

## 2023-02-18 ENCOUNTER — Telehealth: Payer: Self-pay | Admitting: Cardiology

## 2023-02-18 ENCOUNTER — Other Ambulatory Visit: Payer: Self-pay

## 2023-02-18 MED ORDER — SEMAGLUTIDE-WEIGHT MANAGEMENT 2.4 MG/0.75ML ~~LOC~~ SOAJ: 2.4000 mg | SUBCUTANEOUS | 0 refills | Status: DC

## 2023-02-18 NOTE — Telephone Encounter (Signed)
*  STAT* If patient is at the pharmacy, call can be transferred to refill team.   1. Which medications need to be refilled? (please list name of each medication and dose if known) Wegovy   2. Which pharmacy/location (including street and city if local pharmacy) is medication to be sent to?  [id number] - [place], [place] - [address]    3. Do they need a 30 day or 90 day supply? 30

## 2023-02-18 NOTE — Telephone Encounter (Signed)
Disp Refills Start End   Semaglutide-Weight Management 2.4 MG/0.75ML SOAJ 3 mL 0 06/14/2023 07/12/2023   Sig - Route: Inject 2.4 mg into the skin once a week for 28 days. - Subcutaneous   Sent to pharmacy as: Semaglutide-Weight Management 2.4 MG/0.75ML Solution Auto-injector   E-Prescribing Status: Sent to pharmacy (02/18/2023  1:12 PM EDT)    Pharmacy  [id number] - [place], [place] - [address]

## 2023-02-22 ENCOUNTER — Other Ambulatory Visit (HOSPITAL_COMMUNITY): Payer: Self-pay

## 2023-02-22 ENCOUNTER — Telehealth: Payer: Self-pay

## 2023-02-22 NOTE — Telephone Encounter (Signed)
Pharmacy Patient Advocate Encounter   Received notification from CoverMyMeds that prior authorization for Lorraine Jimenez is required/requested.   Insurance verification completed.   The patient is insured through [employer] .   Per test claim: PA required; PA submitted to [employer] [facility] via CoverMyMeds Key/confirmation #/[id number] Status is pending

## 2023-02-23 ENCOUNTER — Other Ambulatory Visit (HOSPITAL_COMMUNITY): Payer: Self-pay

## 2023-02-23 NOTE — Telephone Encounter (Signed)
Pharmacy Patient Advocate Encounter  Received notification from [employer] [facility] that Prior Authorization for wegovy has been APPROVED from 02/23/23 to 02/23/24. Ran test claim, Copay is $0.00 no copay. This test claim was processed through [facility]- copay amounts may vary at other pharmacies due to pharmacy/plan contracts, or as the patient moves through the different stages of their insurance plan.   PA #/Case ID/Reference #: [id number]

## 2023-02-24 ENCOUNTER — Other Ambulatory Visit (HOSPITAL_COMMUNITY): Payer: Self-pay

## 2023-02-24 ENCOUNTER — Telehealth: Payer: Self-pay | Admitting: Cardiology

## 2023-02-24 NOTE — Telephone Encounter (Addendum)
Pharmacy Patient Advocate Encounter   Received notification from Physician's Office that prior authorization for [facility] is required/requested.   Insurance verification completed.   The patient is insured through [employer] [facility] .   Per test claim: The current 28 day co-pay is, $0.  No PA needed at this time. This test claim was processed through [facility]- copay amounts may vary at other pharmacies due to pharmacy/plan contracts, or as the patient moves through the different stages of their insurance plan.

## 2023-02-24 NOTE — Telephone Encounter (Signed)
  Patient states her pharmacy says they have not received the PA for her [facility] Please send

## 2023-02-24 NOTE — Telephone Encounter (Signed)
Spoke to patient and informed her of the following:  "PA request has been Approved. New Encounter created for follow up. For additional info see Pharmacy Prior Auth telephone encounter from 02/23/23.  Pharmacy Patient Advocate Encounter   Received notification from [facility] that Prior Authorization for wegovy has been APPROVED from 02/23/23 to 02/23/24. Ran test claim, Copay is $0.00 no copay. This test claim was processed through [facility]- copay amounts may vary at other pharmacies due to pharmacy/plan contracts, or as the patient moves through the different stages of their insurance plan.    PA #/Case ID/Reference #: [id number]"  Patient stated she would call pharmacy with PA #/Case ID/Reference #: [id number]

## 2023-03-30 ENCOUNTER — Telehealth: Payer: Self-pay | Admitting: Cardiology

## 2023-03-30 ENCOUNTER — Other Ambulatory Visit: Payer: Self-pay

## 2023-03-30 MED ORDER — SEMAGLUTIDE-WEIGHT MANAGEMENT 2.4 MG/0.75ML ~~LOC~~ SOAJ: 2.4000 mg | SUBCUTANEOUS | 0 refills | Status: DC

## 2023-03-30 NOTE — Telephone Encounter (Signed)
Refill sent to pharmacy of choice. 

## 2023-03-30 NOTE — Telephone Encounter (Signed)
*  STAT* If patient is at the pharmacy, call can be transferred to refill team.   1. Which medications need to be refilled? (please list name of each medication and dose if known) Semaglutide-Weight Management 2.4 MG/0.75ML SOAJ  2. Which pharmacy/location (including street and city if local pharmacy) is medication to be sent to? [id number] - [place], [place] - [address] AT [address] OF [address]  3. Do they need a 30 day or 90 day supply?   90 day supply

## 2023-07-05 ENCOUNTER — Encounter: Payer: Self-pay | Admitting: Cardiology

## 2023-07-05 ENCOUNTER — Ambulatory Visit: Payer: No Typology Code available for payment source | Attending: Cardiology | Admitting: Cardiology

## 2023-07-05 VITALS — BP 106/70 | HR 52 | Ht 62.0 in | Wt 185.2 lb

## 2023-07-05 DIAGNOSIS — Z6833 Body mass index (BMI) 33.0-33.9, adult: Secondary | ICD-10-CM | POA: Diagnosis not present

## 2023-07-05 DIAGNOSIS — I2510 Atherosclerotic heart disease of native coronary artery without angina pectoris: Secondary | ICD-10-CM

## 2023-07-05 DIAGNOSIS — I10 Essential (primary) hypertension: Secondary | ICD-10-CM | POA: Diagnosis not present

## 2023-07-05 MED ORDER — ISOSORBIDE MONONITRATE ER 120 MG PO TB24: 120.0000 mg | ORAL_TABLET | Freq: Every day | ORAL | 2 refills | Status: DC

## 2023-07-05 MED ORDER — ATORVASTATIN CALCIUM 40 MG PO TABS: 40.0000 mg | ORAL_TABLET | Freq: Every day | ORAL | 2 refills | Status: DC

## 2023-07-05 MED ORDER — AMLODIPINE BESYLATE 5 MG PO TABS: 5.0000 mg | ORAL_TABLET | Freq: Every day | ORAL | 2 refills | Status: AC

## 2023-07-05 NOTE — Progress Notes (Signed)
Cardiology Office Note:    Date:  07/05/2023   ID:  SHARNELL KNIGHT, DOB [birth year], MRN [id number]  PCP:  Leim Fabry, MD   [facility] Providers Cardiologist:  Debbe Odea, MD     Referring MD: Dan Humphreys, MD   Chief Complaint  Patient presents with   Follow-up    History of Present Illness:    Lorraine Jimenez is a 63 y.o. 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  Doing okay, denies chest pain or shortness of breath.  Has been eating healthier, lost about 20 pounds since last meeting.  Blood pressure is adequately controlled on current meds.   Prior notes Echo 01/05/2021 showed EF 55 to 60%. Left heart cath 01/06/2021 distal RCA 80%, consistent with SCAD.  Past Medical History:  Diagnosis Date   Asthma    Cancer (HCC)    esophageal   CKD (chronic kidney disease), stage III (HCC)    a. 12/2020 Renal duplex: No RAS.   Eczema    Fatty liver    GERD (gastroesophageal reflux disease)    Heart murmur    History of echocardiogram    a. 12/2020 Echo: EF 55-60%, no rwma, triv MR.   Hypertension    Hypothyroidism    MI (myocardial infarction) (HCC)    a. 12/2020 in setting of SCAD (dRCA)-->Med rx.   Multinodular goiter    Sleep apnea    Spontaneous dissection of coronary artery    a. 12/2020 Cath: LM nl, LAD nl, LCX nl, RCA 80d (most consistent w/ SCAD). EF 65%-->Med Rx.    Past Surgical History:  Procedure Laterality Date   ABDOMINAL HYSTERECTOMY     CESAREAN SECTION     COLONOSCOPY     COLONOSCOPY WITH PROPOFOL N/A 03/15/2020   Procedure: COLONOSCOPY WITH PROPOFOL;  Surgeon: Regis Bill, MD;  Location: [facility];  Service: Endoscopy;  Laterality: N/A;   DIAGNOSTIC LAPAROSCOPY     ESOPHAGECTOMY     ESOPHAGOGASTRODUODENOSCOPY (EGD) WITH PROPOFOL N/A 03/15/2020   Procedure: ESOPHAGOGASTRODUODENOSCOPY (EGD) WITH PROPOFOL;  Surgeon: Regis Bill, MD;  Location: [facility];   Service: Endoscopy;  Laterality: N/A;   EXPLORATORY LAPAROTOMY     LEFT HEART CATH AND CORONARY ANGIOGRAPHY N/A 01/06/2021   Procedure: LEFT HEART CATH AND CORONARY ANGIOGRAPHY;  Surgeon: Armando Reichert, MD;  Location: [facility] INVASIVE CV LAB;  Service: Cardiovascular;  Laterality: N/A;   THYROID LOBECTOMY     TUBAL LIGATION     UPPER GI ENDOSCOPY      Current Medications: Current Meds  Medication Sig   albuterol (VENTOLIN HFA) 108 (90 Base) MCG/ACT inhaler Inhale 2 puffs into the lungs every 6 (six) hours as needed for wheezing or shortness of breath.   aspirin EC 81 MG tablet Take 1 tablet (81 mg total) by mouth daily. Swallow whole.   azelastine (ASTELIN) 0.1 % nasal spray Place 2 sprays into both nostrils 2 (two) times daily. Use in each nostril as directed   Calcium Citrate-Vitamin D (CALCIUM CITRATE + PO) Take by mouth daily in the afternoon.   fluticasone (FLONASE) 50 MCG/ACT nasal spray Place into both nostrils daily.   levothyroxine (SYNTHROID) 50 MCG tablet Take 50 mcg by mouth daily before breakfast.   nitroGLYCERIN (NITROSTAT) 0.4 MG SL tablet Place 1 tablet (0.4 mg total) under the tongue every 5 (five) minutes x 3 doses as needed for up to 10 doses for chest pain.  VITAMIN D PO Take by mouth daily.   [DISCONTINUED] amLODipine (NORVASC) 5 MG tablet Take 1 tablet (5 mg total) by mouth daily.   [DISCONTINUED] atorvastatin (LIPITOR) 40 MG tablet Take 1 tablet (40 mg total) by mouth daily.   [DISCONTINUED] isosorbide mononitrate (IMDUR) 120 MG 24 hr tablet Take 1 tablet (120 mg total) by mouth daily.   [DISCONTINUED] metoprolol succinate (TOPROL-XL) 25 MG 24 hr tablet Take 1 tablet (25 mg total) by mouth daily.     Allergies:   Patient has no known allergies.   Social History   Socioeconomic History   Marital status: Married    Spouse name: Not on file   Number of children: Not on file   Years of education: Not on file   Highest education level: Not on file  Occupational  History   Not on file  Tobacco Use   Smoking status: Never    Passive exposure: Past   Smokeless tobacco: Never   Tobacco comments:    2nd hand exposure as child   Vaping Use   Vaping status: Never Used  Substance and Sexual Activity   Alcohol use: Yes    Alcohol/week: 1.0 standard drink of alcohol    Types: 1 Standard drinks or equivalent per week    Comment: once or twice a month, rare   Drug use: Never   Sexual activity: Not on file  Other Topics Concern   Not on file  Social History Narrative   Not on file   Social Drivers of Health   Financial Resource Strain: Low Risk  (12/16/2022)   Received from [facility]   Overall Financial Resource Strain (CARDIA)    Difficulty of Paying Living Expenses: Not hard at all  Food Insecurity: No Food Insecurity (12/16/2022)   Received from [facility]   Hunger Vital Sign    Worried About Running Out of Food in the Last Year: Never true    Ran Out of Food in the Last Year: Never true  Transportation Needs: No Transportation Needs (12/16/2022)   Received from [facility] - Transportation    In the past 12 months, has lack of transportation kept you from medical appointments or from getting medications?: No    Lack of Transportation (Non-Medical): No  Physical Activity: Insufficiently Active (12/04/2019)   Received from [facility], [facility]   Exercise Vital Sign    Days of Exercise per Week: 1 day    Minutes of Exercise per Session: 10 min  Stress: Not on file (04/06/2023)  Social Connections: Unknown (12/04/2019)   Received from [facility], [facility]   Social Connection and Isolation Panel [NHANES]    Frequency of Communication with Friends and Family: Twice a week    Frequency of Social Gatherings with Friends and Family: Once a week    Attends Religious Services: Patient declined    Automotive engineer or Organizations: No    Attends Engineer, structural: Patient declined    Marital Status: Married     Family History: The patient's family history includes COPD in her mother; Coronary artery disease in her father; Diabetes Mellitus II in her mother; Heart attack in her father; Heart disease in her father; Hypertension in her mother and sister; Hypothyroidism in her mother; Leukemia in her father; Thyroid disease in her mother and sister. There is no history of Breast cancer.  ROS:   Please see the history of present illness.     All other systems reviewed and are negative.  EKGs/Labs/Other Studies Reviewed:    The following studies were reviewed today:   EKG Interpretation Date/Time:  Monday July 05 2023 13:47:22 EST Ventricular Rate:  52 PR Interval:  154 QRS Duration:  92 QT Interval:  458 QTC Calculation: 425 R Axis:   12  Text Interpretation: Sinus bradycardia with Premature supraventricular complexes Confirmed by Debbe Odea ([id number]) on 07/05/2023 1:52:12 PM    Recent Labs: No results found for requested labs within last 365 days.  Recent Lipid Panel    Component Value Date/Time   CHOL 101 01/30/2021 0529   TRIG 70 01/30/2021 0529   HDL 44 01/30/2021 0529   CHOLHDL 2.3 01/30/2021 0529   VLDL 14 01/30/2021 0529   LDLCALC 43 01/30/2021 0529     Risk Assessment/Calculations:          Physical Exam:    VS:  BP 106/70 (BP Location: Left Arm, Patient Position: Sitting, Cuff Size: Large)   Pulse (!) 52   Ht 5\' 2"  (1.575 m)   Wt 185 lb 3.2 oz (84 kg)   SpO2 97%   BMI 33.87 kg/m     Wt Readings from Last 3 Encounters:  07/05/23 185 lb 3.2 oz (84 kg)  09/15/22 199 lb 6.4 oz (90.4 kg)  07/02/22 206 lb (93.4 kg)     GEN:  Well nourished, well developed in no acute distress HEENT: Normal NECK: No JVD; No carotid bruits CARDIAC: RRR, no murmurs, rubs, gallops RESPIRATORY:  Clear to auscultation without rales, wheezing or  rhonchi  ABDOMEN: Soft, non-tender, non-distended MUSCULOSKELETAL:  No edema; No deformity  SKIN: Warm and dry NEUROLOGIC:  Alert and oriented x 3 PSYCHIATRIC:  Normal affect   ASSESSMENT:    1. Coronary artery disease involving native coronary artery of native heart without angina pectoris   2. Primary hypertension   3. 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  Denies chest pain.  Bradycardia, heart rate 52.  Stop Toprol-XL 25 mg daily.  Continue aspirin, Lipitor, Imdur 120 mg daily, amlodipine 5 mg daily.  LDL at goal.  Exercises up to 50 to 70% maximum predicted heart rate to limit arterial shear stress.   Hypertension, BP controlled.  Bradycardic.  Stop Toprol-XL.  Continue amlodipine 5 mg daily, Imdur 120 mg daily. Obesity, has lost about 20 pounds since last visit.  Congratulated on weight loss.  Advised to continue eating healthy.  Continue low-calorie diet.  Follow-up in 1 year.    Medication Adjustments/Labs and Tests Ordered: Current medicines are reviewed at length with the patient today.  Concerns regarding medicines are outlined above.  Orders Placed This Encounter  Procedures   EKG 12-Lead     Meds ordered this encounter  Medications   isosorbide mononitrate (IMDUR) 120 MG 24 hr tablet    Sig: Take 1 tablet (120 mg total) by mouth daily.    Dispense:  90 tablet    Refill:  2   atorvastatin (LIPITOR) 40 MG tablet    Sig: Take 1 tablet (40 mg total) by mouth daily.    Dispense:  90 tablet    Refill:  2   amLODipine (NORVASC) 5 MG tablet    Sig: Take 1 tablet (5 mg total) by mouth daily.  Dispense:  90 tablet    Refill:  2     Patient Instructions  Medication Instructions:   STOP Metoprolol   *If you need a refill on your cardiac medications before your next appointment, please call your pharmacy*   Lab Work:  None Ordered  If you have labs  (blood work) drawn today and your tests are completely normal, you will receive your results only by: MyChart Message (if you have MyChart) OR A paper copy in the mail If you have any lab test that is abnormal or we need to change your treatment, we will call you to review the results.   Testing/Procedures:  None Ordered   Follow-Up: At [facility], you and your health needs are our priority.  As part of our continuing mission to provide you with exceptional heart care, we have created designated Provider Care Teams.  These Care Teams include your primary Cardiologist (physician) and Advanced Practice Providers (APPs -  Physician Assistants and Nurse Practitioners) who all work together to provide you with the care you need, when you need it.  We recommend signing up for the patient portal called "MyChart".  Sign up information is provided on this After Visit Summary.  MyChart is used to connect with patients for Virtual Visits (Telemedicine).  Patients are able to view lab/test results, encounter notes, upcoming appointments, etc.  Non-urgent messages can be sent to your provider as well.   To learn more about what you can do with MyChart, go to [email or web address].    Your next appointment:   12 month(s)  Provider:   You may see Debbe Odea, MD or one of the following Advanced Practice Providers on your designated Care Team:   Nicolasa Ducking, NP Eula Listen, PA-C Cadence Fransico Michael, PA-C Charlsie Quest, NP Carlos Levering, NP     Signed, Debbe Odea, MD  07/05/2023 3:44 PM    [facility]

## 2023-07-05 NOTE — Patient Instructions (Signed)
Medication Instructions:   STOP Metoprolol   *If you need a refill on your cardiac medications before your next appointment, please call your pharmacy*   Lab Work:  None Ordered  If you have labs (blood work) drawn today and your tests are completely normal, you will receive your results only by: MyChart Message (if you have MyChart) OR A paper copy in the mail If you have any lab test that is abnormal or we need to change your treatment, we will call you to review the results.   Testing/Procedures:  None Ordered   Follow-Up: At [facility], you and your health needs are our priority.  As part of our continuing mission to provide you with exceptional heart care, we have created designated Provider Care Teams.  These Care Teams include your primary Cardiologist (physician) and Advanced Practice Providers (APPs -  Physician Assistants and Nurse Practitioners) who all work together to provide you with the care you need, when you need it.  We recommend signing up for the patient portal called "MyChart".  Sign up information is provided on this After Visit Summary.  MyChart is used to connect with patients for Virtual Visits (Telemedicine).  Patients are able to view lab/test results, encounter notes, upcoming appointments, etc.  Non-urgent messages can be sent to your provider as well.   To learn more about what you can do with MyChart, go to [email or web address].    Your next appointment:   12 month(s)  Provider:   You may see Debbe Odea, MD or one of the following Advanced Practice Providers on your designated Care Team:   Nicolasa Ducking, NP Eula Listen, PA-C Cadence Fransico Michael, PA-C Charlsie Quest, NP Carlos Levering, NP

## 2023-09-16 ENCOUNTER — Ambulatory Visit: Payer: No Typology Code available for payment source | Admitting: Sleep Medicine

## 2023-09-16 ENCOUNTER — Encounter: Payer: Self-pay | Admitting: Sleep Medicine

## 2023-09-16 VITALS — BP 120/78 | HR 75 | Temp 98.0°F | Ht 62.0 in | Wt 189.2 lb

## 2023-09-16 DIAGNOSIS — G4733 Obstructive sleep apnea (adult) (pediatric): Secondary | ICD-10-CM

## 2023-09-16 DIAGNOSIS — I10 Essential (primary) hypertension: Secondary | ICD-10-CM | POA: Diagnosis not present

## 2023-09-16 DIAGNOSIS — E66812 Obesity, class 2: Secondary | ICD-10-CM

## 2023-09-16 NOTE — Patient Instructions (Signed)

## 2023-09-16 NOTE — Progress Notes (Signed)
 Name:Lorraine Jimenez MRN: [id number] DOB: 09-30-1960   CHIEF COMPLAINT:  BiPAP follow up visit   HISTORY OF PRESENT ILLNESS:  Mrs. Lorraine Jimenez is a 63 y.o. w/ a h/o OSA, obesity, HTN, hypothyroidism, hyperlipidemia and asthma who presents for PAP follow up visit. Reports using BiPAP therapy every night, which is confirmed by compliance data. She is currently using the Airfit N20 nasal mask, which is comfortable. Reports feeling more refreshed upon awakening with CPAP therapy. Reports starting Wegovy last year and has lost 20 lbs total since then. Denies drowsy driving. Drinks 1/2 gallon of tea weekly, occasional alcohol use, denies tobacco or illicit drug use.   Bedtime 10-11 pm Sleep onset 5 mins Rise time 5 am   EPWORTH SLEEP SCORE 14    09/16/2023    3:00 PM 10/17/2021    4:21 PM 03/05/2021    4:00 PM  Results of the Epworth flowsheet  Sitting and reading 3 3 3   Watching TV 3 3 3   Sitting, inactive in a public place (e.g. a theatre or a meeting) 2 1 2   As a passenger in a car for an hour without a break 1 1 3   Lying down to rest in the afternoon when circumstances permit 3 3 3   Sitting and talking to someone 2 1 2   Sitting quietly after a lunch without alcohol 0 2 3  In a car, while stopped for a few minutes in traffic 0 1 1  Total score 14 15 20     PAST MEDICAL HISTORY :   has a past medical history of Asthma, Cancer (HCC), CKD (chronic kidney disease), stage III (HCC), Eczema, Fatty liver, GERD (gastroesophageal reflux disease), Heart murmur, History of echocardiogram, Hypertension, Hypothyroidism, MI (myocardial infarction) (HCC), Multinodular goiter, Sleep apnea, and Spontaneous dissection of coronary artery.  has a past surgical history that includes Cesarean section; Colonoscopy; Esophagectomy; Exploratory laparotomy; Abdominal hysterectomy; Thyroid lobectomy; Tubal ligation; Upper gi endoscopy; Diagnostic laparoscopy; Colonoscopy with propofol (N/A, 03/15/2020);  Esophagogastroduodenoscopy (egd) with propofol (N/A, 03/15/2020); and LEFT HEART CATH AND CORONARY ANGIOGRAPHY (N/A, 01/06/2021). Prior to Admission medications   Medication Sig Start Date End Date Taking? Authorizing Provider  albuterol (VENTOLIN HFA) 108 (90 Base) MCG/ACT inhaler Inhale 2 puffs into the lungs every 6 (six) hours as needed for wheezing or shortness of breath. 09/15/22  Yes Coralyn Helling, MD  amLODipine (NORVASC) 5 MG tablet Take 1 tablet (5 mg total) by mouth daily. 07/05/23  Yes Debbe Odea, MD  aspirin EC 81 MG tablet Take 1 tablet (81 mg total) by mouth daily. Swallow whole. 12/29/21  Yes Agbor-Etang, Arlys John, MD  atorvastatin (LIPITOR) 40 MG tablet Take 1 tablet (40 mg total) by mouth daily. 07/05/23  Yes Debbe Odea, MD  azelastine (ASTELIN) 0.1 % nasal spray Place 2 sprays into both nostrils 2 (two) times daily. Use in each nostril as directed   Yes [provider]  Calcium Citrate-Vitamin D (CALCIUM CITRATE + PO) Take by mouth daily in the afternoon.   Yes [provider]  fluticasone (FLONASE) 50 MCG/ACT nasal spray Place into both nostrils daily.   Yes [provider]  isosorbide mononitrate (IMDUR) 120 MG 24 hr tablet Take 1 tablet (120 mg total) by mouth daily. 07/05/23  Yes Debbe Odea, MD  levothyroxine (SYNTHROID) 50 MCG tablet Take 50 mcg by mouth daily before breakfast.   Yes [provider]  Multiple Vitamin (MULTIVITAMIN) tablet Take 1 tablet by mouth daily.   Yes [provider]  nitroGLYCERIN (NITROSTAT) 0.4 MG SL tablet Place 1 tablet (0.4 mg total) under the tongue every 5 (five) minutes x 3 doses as needed for up to 10 doses for chest pain. 01/08/21  Yes Dahal, Melina Schools, MD  VITAMIN D PO Take by mouth daily.   Yes [provider]  WEGOVY 0.5 MG/0.5ML SOAJ Inject 0.5 mg into the skin once a week.   Yes [provider]   Allergies  Allergen Reactions   Cat Dander Cough   Dog Epithelium Cough    Grass Pollen(K-O-R-T-Swt Vern) Cough and Itching    FAMILY HISTORY:  family history includes COPD in her mother; Coronary artery disease in her father; Diabetes Mellitus II in her mother; Heart attack in her father; Heart disease in her father; Hypertension in her mother and sister; Hypothyroidism in her mother; Leukemia in her father; Thyroid disease in her mother and sister. SOCIAL HISTORY:  reports that she has never smoked. She has been exposed to tobacco smoke. She has never used smokeless tobacco. She reports current alcohol use of about 1.0 standard drink of alcohol per week. She reports that she does not use drugs.   Review of Systems:  Gen:  Denies  fever, sweats, chills weight loss  HEENT: Denies blurred vision, double vision, ear pain, eye pain, hearing loss, nose bleeds, sore throat Cardiac:  No dizziness, chest pain or heaviness, chest tightness,edema, No JVD Resp:   No cough, -sputum production, -shortness of breath,-wheezing, -hemoptysis,  Gi: Denies swallowing difficulty, stomach pain, nausea or vomiting, diarrhea, constipation, bowel incontinence Gu:  Denies bladder incontinence, burning urine Ext:   Denies Joint pain, stiffness or swelling Skin: Denies  skin rash, easy bruising or bleeding or hives Endoc:  Denies polyuria, polydipsia , polyphagia or weight change Psych:   Denies depression, insomnia or hallucinations  Other:  All other systems negative  VITAL SIGNS: BP 120/78 (BP Location: Right Arm, Patient Position: Sitting, Cuff Size: Large)   Pulse 75   Temp 98 F (36.7 C) (Oral)   Ht 5\' 2"  (1.575 m)   Wt 189 lb 3.2 oz (85.8 kg)   SpO2 95%   BMI 34.61 kg/m    Physical Examination:   General Appearance: No distress  EYES PERRLA, EOM intact.   NECK Supple, No JVD Pulmonary: normal breath sounds, No wheezing.  CardiovascularNormal S1,S2.  No m/r/g.   Abdomen: Benign, Soft, non-tender. Skin:   warm, no rashes, no ecchymosis  Extremities: normal, no  cyanosis, clubbing. Neuro:without focal findings,  speech normal  PSYCHIATRIC: Mood, affect within normal limits.   ASSESSMENT AND PLAN  OSA Patient is using and benefiting from PAP therapy. Discussed the consequences of untreated sleep apnea. Advised not to drive drowsy for safety of patient and others. Will follow up in 1 year.   HTN Stable, on current management. Following with PCP.   Insufficient sleep syndrome Counseled patient on increasing total sleep time to 7-8 hours per night.    Patient  satisfied with Plan of action and management. All questions answered  I spent a total of 43 minutes reviewing chart data, face-to-face evaluation with the patient, counseling and coordination of care as detailed above.    Tempie Hoist, M.D.  Sleep Medicine [facility]

## 2023-11-19 ENCOUNTER — Telehealth: Payer: Self-pay | Admitting: Cardiology

## 2023-11-19 NOTE — Telephone Encounter (Signed)
 Pt called in asking if she can go off on some of her medications because she feels better and her bp has stabilized. Please advise.

## 2023-12-06 ENCOUNTER — Ambulatory Visit: Admitting: Cardiology

## 2023-12-07 ENCOUNTER — Encounter: Payer: Self-pay | Admitting: Cardiology

## 2023-12-07 ENCOUNTER — Ambulatory Visit: Attending: Cardiology | Admitting: Cardiology

## 2023-12-07 VITALS — BP 102/74 | HR 82 | Ht 62.0 in | Wt 184.0 lb

## 2023-12-07 DIAGNOSIS — Z6833 Body mass index (BMI) 33.0-33.9, adult: Secondary | ICD-10-CM

## 2023-12-07 DIAGNOSIS — I10 Essential (primary) hypertension: Secondary | ICD-10-CM | POA: Diagnosis not present

## 2023-12-07 DIAGNOSIS — I2542 Coronary artery dissection: Secondary | ICD-10-CM | POA: Diagnosis not present

## 2023-12-07 NOTE — Patient Instructions (Signed)

## 2023-12-07 NOTE — Progress Notes (Signed)
 Cardiology Office Note:    Date:  12/07/2023   ID:  Lorraine Jimenez, DOB [birth year], MRN [id number]  PCP:  Jyl Railing, MD   [facility] Providers Cardiologist:  Redell Cave, MD     Referring MD: Jyl Railing, MD   Chief Complaint  Patient presents with   Follow-up    Discuss BP medication no complaints today. Meds reviewed verbally with pt.    History of Present Illness:    Lorraine Jimenez is a 63 y.o. 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  Previously had bradycardia with low-dose Toprol -XL, heart rates have normalized with stopping metoprolol .  Denies chest pain or shortness of breath.  Eating healthier, also takes GLP-1 to help with weight loss.  Wondering if she can come off some BP medications.  Otherwise doing well with no concerns at this time.  Prior notes Echo 01/05/2021 showed EF 55 to 60%. Left heart cath 01/06/2021 distal RCA 80%, consistent with SCAD.  Past Medical History:  Diagnosis Date   Asthma    Cancer (HCC)    esophageal   CKD (chronic kidney disease), stage III (HCC)    a. 12/2020 Renal duplex: No RAS.   Eczema    Fatty liver    GERD (gastroesophageal reflux disease)    Heart murmur    History of echocardiogram    a. 12/2020 Echo: EF 55-60%, no rwma, triv MR.   Hypertension    Hypothyroidism    MI (myocardial infarction) (HCC)    a. 12/2020 in setting of SCAD (dRCA)-->Med rx.   Multinodular goiter    Sleep apnea    Spontaneous dissection of coronary artery    a. 12/2020 Cath: LM nl, LAD nl, LCX nl, RCA 80d (most consistent w/ SCAD). EF 65%-->Med Rx.    Past Surgical History:  Procedure Laterality Date   ABDOMINAL HYSTERECTOMY     CESAREAN SECTION     COLONOSCOPY     COLONOSCOPY WITH PROPOFOL  N/A 03/15/2020   Procedure: COLONOSCOPY WITH PROPOFOL ;  Surgeon: Maryruth Ole DASEN, MD;  Location: [facility] ENDOSCOPY;  Service: Endoscopy;  Laterality: N/A;   DIAGNOSTIC LAPAROSCOPY      ESOPHAGECTOMY     ESOPHAGOGASTRODUODENOSCOPY (EGD) WITH PROPOFOL  N/A 03/15/2020   Procedure: ESOPHAGOGASTRODUODENOSCOPY (EGD) WITH PROPOFOL ;  Surgeon: Maryruth Ole DASEN, MD;  Location: [facility] ENDOSCOPY;  Service: Endoscopy;  Laterality: N/A;   EXPLORATORY LAPAROTOMY     LEFT HEART CATH AND CORONARY ANGIOGRAPHY N/A 01/06/2021   Procedure: LEFT HEART CATH AND CORONARY ANGIOGRAPHY;  Surgeon: Lawyer Bernardino Cough, MD;  Location: [facility] INVASIVE CV LAB;  Service: Cardiovascular;  Laterality: N/A;   THYROID  LOBECTOMY     TUBAL LIGATION     UPPER GI ENDOSCOPY      Current Medications: Current Meds  Medication Sig   albuterol  (VENTOLIN  HFA) 108 (90 Base) MCG/ACT inhaler Inhale 2 puffs into the lungs every 6 (six) hours as needed for wheezing or shortness of breath.   amLODipine  (NORVASC ) 5 MG tablet Take 1 tablet (5 mg total) by mouth daily.   aspirin  EC 81 MG tablet Take 1 tablet (81 mg total) by mouth daily. Swallow whole.   atorvastatin  (LIPITOR) 40 MG tablet Take 1 tablet (40 mg total) by mouth daily.   azelastine  (ASTELIN ) 0.1 % nasal spray Place 2 sprays into both nostrils 2 (two) times daily. Use in each nostril as directed   Calcium  Citrate-Vitamin D  (CALCIUM  CITRATE + PO) Take by mouth daily  in the afternoon.   fluticasone (FLONASE) 50 MCG/ACT nasal spray Place into both nostrils daily.   isosorbide  mononitrate (IMDUR ) 120 MG 24 hr tablet Take 1 tablet (120 mg total) by mouth daily.   levothyroxine  (SYNTHROID ) 50 MCG tablet Take 50 mcg by mouth daily before breakfast.   Multiple Vitamin (MULTIVITAMIN) tablet Take 1 tablet by mouth daily.   nitroGLYCERIN  (NITROSTAT ) 0.4 MG SL tablet Place 1 tablet (0.4 mg total) under the tongue every 5 (five) minutes x 3 doses as needed for up to 10 doses for chest pain.   Semaglutide -Weight Management (WEGOVY ) 2.4 MG/0.75ML SOAJ Inject 2.4 mg into the skin once a week.   VITAMIN D  PO Take by mouth daily.     Allergies:   Cat dander, Dog epithelium, and  Grass pollen([id number]-o-[id number]-swt vern)   Social History   Socioeconomic History   Marital status: Married    Spouse name: Not on file   Number of children: Not on file   Years of education: Not on file   Highest education level: Not on file  Occupational History   Not on file  Tobacco Use   Smoking status: Never    Passive exposure: Past   Smokeless tobacco: Never   Tobacco comments:    2nd hand exposure as child   Vaping Use   Vaping status: Never Used  Substance and Sexual Activity   Alcohol use: Yes    Alcohol/week: 1.0 standard drink of alcohol    Types: 1 Standard drinks or equivalent per week    Comment: once or twice a month, rare   Drug use: Never   Sexual activity: Not on file  Other Topics Concern   Not on file  Social History Narrative   Not on file   Social Drivers of Health   Financial Resource Strain: Low Risk  (12/16/2022)   Received from [facility]   Overall Financial Resource Strain (CARDIA)    Difficulty of Paying Living Expenses: Not hard at all  Food Insecurity: No Food Insecurity (12/16/2022)   Received from [facility]   Hunger Vital Sign    Within the past 12 months, you worried that your food would run out before you got the money to buy more.: Never true    Within the past 12 months, the food you bought just didn't last and you didn't have money to get more.: Never true  Transportation Needs: No Transportation Needs (12/16/2022)   Received from [facility] - Transportation    In the past 12 months, has lack of transportation kept you from medical appointments or from getting medications?: No    Lack of Transportation (Non-Medical): No  Physical Activity: Insufficiently Active (12/04/2019)   Received from [facility]   Exercise Vital Sign    On average, how many days per week do you engage in moderate to strenuous exercise (like a brisk walk)?: 1 day    On average, how  many minutes do you engage in exercise at this level?: 10 min  Stress: Not on file (04/06/2023)  Social Connections: Unknown (12/04/2019)   Received from [facility]   Social Connection and Isolation Panel    In a typical week, how many times do you talk on the phone with family, friends, or neighbors?: Twice a week    How often do you get together with friends or relatives?: Once a week    How often do you  attend church or religious services?: Patient declined    Do you belong to any clubs or organizations such as church groups, unions, fraternal or athletic groups, or school groups?: No    How often do you attend meetings of the clubs or organizations you belong to?: Patient declined    Are you married, widowed, divorced, separated, never married, or living with a partner?: Married     Family History: The patient's family history includes COPD in her mother; Coronary artery disease in her father; Diabetes Mellitus II in her mother; Heart attack in her father; Heart disease in her father; Hypertension in her mother and sister; Hypothyroidism in her mother; Leukemia in her father; Thyroid  disease in her mother and sister. There is no history of Breast cancer.  ROS:   Please see the history of present illness.     All other systems reviewed and are negative.  EKGs/Labs/Other Studies Reviewed:    The following studies were reviewed today:   EKG Interpretation Date/Time:  Tuesday December 07 2023 07:59:46 EDT Ventricular Rate:  82 PR Interval:  150 QRS Duration:  94 QT Interval:  368 QTC Calculation: 429 R Axis:   -1  Text Interpretation: Normal sinus rhythm Low voltage QRS Inferior infarct , age undetermined Confirmed by Darliss Rogue ([id number]) on 12/07/2023 8:13:51 AM    Recent Labs: No results found for requested labs within last 365 days.  Recent Lipid Panel    Component Value Date/Time   CHOL 101 01/30/2021 0529   TRIG 70 01/30/2021 0529   HDL 44 01/30/2021  0529   CHOLHDL 2.3 01/30/2021 0529   VLDL 14 01/30/2021 0529   LDLCALC 43 01/30/2021 0529     Risk Assessment/Calculations:          Physical Exam:    VS:  BP 102/74 (BP Location: Left Arm, Patient Position: Sitting, Cuff Size: Large)   Pulse 82   Ht 5' 2 (1.575 m)   Wt 184 lb (83.5 kg)   SpO2 98%   BMI 33.65 kg/m     Wt Readings from Last 3 Encounters:  12/07/23 184 lb (83.5 kg)  09/16/23 189 lb 3.2 oz (85.8 kg)  07/05/23 185 lb 3.2 oz (84 kg)     GEN:  Well nourished, well developed in no acute distress HEENT: Normal NECK: No JVD; No carotid bruits CARDIAC: RRR, no murmurs, rubs, gallops RESPIRATORY:  Clear to auscultation without rales, wheezing or rhonchi  ABDOMEN: Soft, non-tender, non-distended MUSCULOSKELETAL:  No edema; No deformity  SKIN: Warm and dry NEUROLOGIC:  Alert and oriented x 3 PSYCHIATRIC:  Normal affect   ASSESSMENT:    1. Spontaneous dissection of coronary artery   2. Primary hypertension   3. BMI 33.0-33.9,adult    PLAN:    In order of problems listed above:  Spontaneous coronary artery dissection in the RCA, 80% distal RCA stenosis.  Previously bradycardic, heart rates normalized with stopping beta-blocker.  Denies chest pain.  Continue aspirin , Lipitor, Imdur  120 mg daily, amlodipine  5 mg daily.  LDL at goal.  Exercises up to 50 to 70% maximum predicted heart rate to limit arterial shear stress.   Hypertension, BP controlled. Continue amlodipine  5 mg daily, Imdur  120 mg daily. Obesity, continue low-calorie diet, weight loss.  On GLP-1.  Follow-up in 1 year.    Medication Adjustments/Labs and Tests Ordered: Current medicines are reviewed at length with the patient today.  Concerns regarding medicines are outlined above.  Orders Placed This Encounter  Procedures  EKG 12-Lead     No orders of the defined types were placed in this encounter.    Patient Instructions  Medication Instructions:  Your physician recommends that you  continue on your current medications as directed. Please refer to the Current Medication list given to you today.   *If you need a refill on your cardiac medications before your next appointment, please call your pharmacy*  Lab Work: No labs ordered today  If you have labs (blood work) drawn today and your tests are completely normal, you will receive your results only by: MyChart Message (if you have MyChart) OR A paper copy in the mail If you have any lab test that is abnormal or we need to change your treatment, we will call you to review the results.  Testing/Procedures: No test ordered today   Follow-Up: At [facility], you and your health needs are our priority.  As part of our continuing mission to provide you with exceptional heart care, our providers are all part of one team.  This team includes your primary Cardiologist (physician) and Advanced Practice Providers or APPs (Physician Assistants and Nurse Practitioners) who all work together to provide you with the care you need, when you need it.  Your next appointment:   1 year(s)  Provider:   You may see Redell Cave, MD or one of the following Advanced Practice Providers on your designated Care Team:   Lonni Meager, NP Lesley Maffucci, PA-C Bernardino Bring, PA-C Cadence [place], PA-C Tylene Lunch, NP Barnie Hila, NP    We recommend signing up for the patient portal called MyChart.  Sign up information is provided on this After Visit Summary.  MyChart is used to connect with patients for Virtual Visits (Telemedicine).  Patients are able to view lab/test results, encounter notes, upcoming appointments, etc.  Non-urgent messages can be sent to your provider as well.   To learn more about what you can do with MyChart, go to [email or web address].      Signed, Redell Cave, MD  12/07/2023 9:06 AM    [facility]

## 2024-01-10 ENCOUNTER — Other Ambulatory Visit: Payer: Self-pay | Admitting: Family Medicine

## 2024-01-10 DIAGNOSIS — Z1231 Encounter for screening mammogram for malignant neoplasm of breast: Secondary | ICD-10-CM

## 2024-01-25 ENCOUNTER — Other Ambulatory Visit (HOSPITAL_COMMUNITY): Payer: Self-pay

## 2024-01-25 ENCOUNTER — Telehealth: Payer: Self-pay | Admitting: Pharmacy Technician

## 2024-01-25 NOTE — Telephone Encounter (Signed)
 Pharmacy Patient Advocate Encounter   Received notification from CoverMyMeds that prior authorization for WEGOVY  is required/requested.   Insurance verification completed.   The patient is insured through [facility] .   Per test claim: PA required; PA started via CoverMyMeds. KEY [id number] . Waiting for clinical questions to populate.

## 2024-01-25 NOTE — Telephone Encounter (Signed)
 The prior authorization got cancelled saying    I called the patient and she said she is going through virta so nothing else for us  to do and to cancel the pa request

## 2024-02-06 ENCOUNTER — Other Ambulatory Visit: Payer: Self-pay | Admitting: Medical Genetics

## 2024-02-11 ENCOUNTER — Ambulatory Visit
Admission: RE | Admit: 2024-02-11 | Discharge: 2024-02-11 | Disposition: A | Discharge status: Home / Self Care | Payer: Self-pay | Source: Ambulatory Visit | Attending: Family Medicine | Admitting: Family Medicine

## 2024-02-11 ENCOUNTER — Other Ambulatory Visit
Admission: RE | Admit: 2024-02-11 | Discharge: 2024-02-11 | Disposition: A | Discharge status: Home / Self Care | Payer: Self-pay | Source: Ambulatory Visit | Attending: Medical Genetics | Admitting: Medical Genetics

## 2024-02-11 DIAGNOSIS — Z1231 Encounter for screening mammogram for malignant neoplasm of breast: Secondary | ICD-10-CM | POA: Diagnosis present

## 2024-02-21 LAB — GENECONNECT MOLECULAR SCREEN: Genetic Analysis Overall Interpretation: NEGATIVE

## 2024-03-26 ENCOUNTER — Other Ambulatory Visit: Payer: Self-pay | Admitting: Cardiology

## 2024-05-01 ENCOUNTER — Other Ambulatory Visit: Payer: Self-pay | Admitting: Cardiology

## 2024-05-04 ENCOUNTER — Telehealth: Payer: Self-pay | Admitting: Cardiology

## 2024-05-04 MED ORDER — ISOSORBIDE MONONITRATE ER 120 MG PO TB24: 120.0000 mg | ORAL_TABLET | Freq: Every day | ORAL | 2 refills | Status: AC

## 2024-05-04 NOTE — Telephone Encounter (Signed)
*  STAT* If patient is at the pharmacy, call can be transferred to refill team.   1. Which medications need to be refilled? (please list name of each medication and dose if known)   isosorbide  mononitrate (IMDUR ) 120 MG 24 hr tablet    2. Which pharmacy/location (including street and city if local pharmacy) is medication to be sent to?  [id number] - [place], [place] - [address]      3. Do they need a 30 day or 90 day supply? 90 day

## 2024-05-04 NOTE — Telephone Encounter (Signed)
 Pt's medication was sent to pt's pharmacy as requested. Confirmation received.

## 2024-07-12 ENCOUNTER — Ambulatory Visit: Admitting: Cardiology

## 2024-09-06 ENCOUNTER — Ambulatory Visit: Admitting: Sleep Medicine
